# Patient Record
Sex: Female | Born: 1965 | Race: White | Hispanic: No | Marital: Married | Smoking: Never smoker
Health system: Southern US, Community
[De-identification: ages and names within clinical notes are randomized; demographics above are authoritative.]

## PROBLEM LIST (undated history)

## (undated) DIAGNOSIS — Z86718 Personal history of other venous thrombosis and embolism: Secondary | ICD-10-CM

## (undated) DIAGNOSIS — M1990 Unspecified osteoarthritis, unspecified site: Secondary | ICD-10-CM

## (undated) DIAGNOSIS — E559 Vitamin D deficiency, unspecified: Secondary | ICD-10-CM

## (undated) DIAGNOSIS — Z86711 Personal history of pulmonary embolism: Secondary | ICD-10-CM

## (undated) DIAGNOSIS — E882 Lipomatosis, not elsewhere classified: Secondary | ICD-10-CM

## (undated) DIAGNOSIS — I872 Venous insufficiency (chronic) (peripheral): Secondary | ICD-10-CM

## (undated) DIAGNOSIS — E7800 Pure hypercholesterolemia, unspecified: Secondary | ICD-10-CM

## (undated) DIAGNOSIS — Z87448 Personal history of other diseases of urinary system: Secondary | ICD-10-CM

## (undated) DIAGNOSIS — T8859XA Other complications of anesthesia, initial encounter: Secondary | ICD-10-CM

## (undated) DIAGNOSIS — K760 Fatty (change of) liver, not elsewhere classified: Secondary | ICD-10-CM

## (undated) DIAGNOSIS — G4733 Obstructive sleep apnea (adult) (pediatric): Secondary | ICD-10-CM

## (undated) HISTORY — DX: Personal history of other diseases of urinary system: Z87.448

## (undated) HISTORY — DX: Vitamin D deficiency, unspecified: E55.9

## (undated) HISTORY — DX: Personal history of pulmonary embolism: Z86.711

## (undated) HISTORY — DX: Personal history of other venous thrombosis and embolism: Z86.718

## (undated) HISTORY — DX: Venous insufficiency (chronic) (peripheral): I87.2

## (undated) HISTORY — DX: Pure hypercholesterolemia, unspecified: E78.00

## (undated) HISTORY — DX: Obstructive sleep apnea (adult) (pediatric): G47.33

## (undated) HISTORY — DX: Fatty (change of) liver, not elsewhere classified: K76.0

## (undated) HISTORY — PX: REPLACEMENT TOTAL KNEE: SUR1224

## (undated) HISTORY — DX: Unspecified osteoarthritis, unspecified site: M19.90

## (undated) HISTORY — PX: ABDOMINAL HYSTERECTOMY: SHX81

## (undated) HISTORY — PX: WISDOM TOOTH EXTRACTION: SHX21

## (undated) HISTORY — PX: NOSE SURGERY: SHX723

---
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
## 2007-04-28 HISTORY — PX: ARTHROSCOPY KNEE W/ DRILLING: SUR92

## 2008-04-27 HISTORY — PX: INCONTINENCE SURGERY: SHX676

## 2020-03-06 DIAGNOSIS — J0100 Acute maxillary sinusitis, unspecified: Secondary | ICD-10-CM | POA: Diagnosis not present

## 2020-05-01 DIAGNOSIS — Z20828 Contact with and (suspected) exposure to other viral communicable diseases: Secondary | ICD-10-CM | POA: Diagnosis not present

## 2020-05-01 DIAGNOSIS — J029 Acute pharyngitis, unspecified: Secondary | ICD-10-CM | POA: Diagnosis not present

## 2020-05-01 DIAGNOSIS — R519 Headache, unspecified: Secondary | ICD-10-CM | POA: Diagnosis not present

## 2020-05-16 DIAGNOSIS — Z20828 Contact with and (suspected) exposure to other viral communicable diseases: Secondary | ICD-10-CM | POA: Diagnosis not present

## 2020-05-16 DIAGNOSIS — R0981 Nasal congestion: Secondary | ICD-10-CM | POA: Diagnosis not present

## 2020-05-16 DIAGNOSIS — R519 Headache, unspecified: Secondary | ICD-10-CM | POA: Diagnosis not present

## 2020-05-16 DIAGNOSIS — J0100 Acute maxillary sinusitis, unspecified: Secondary | ICD-10-CM | POA: Diagnosis not present

## 2020-06-10 DIAGNOSIS — E559 Vitamin D deficiency, unspecified: Secondary | ICD-10-CM | POA: Diagnosis not present

## 2020-06-10 DIAGNOSIS — K760 Fatty (change of) liver, not elsewhere classified: Secondary | ICD-10-CM | POA: Diagnosis not present

## 2020-06-10 DIAGNOSIS — E785 Hyperlipidemia, unspecified: Secondary | ICD-10-CM | POA: Diagnosis not present

## 2020-06-10 DIAGNOSIS — Z131 Encounter for screening for diabetes mellitus: Secondary | ICD-10-CM | POA: Diagnosis not present

## 2020-06-10 DIAGNOSIS — E079 Disorder of thyroid, unspecified: Secondary | ICD-10-CM | POA: Diagnosis not present

## 2020-06-24 DIAGNOSIS — G4719 Other hypersomnia: Secondary | ICD-10-CM | POA: Diagnosis not present

## 2020-06-24 DIAGNOSIS — E7800 Pure hypercholesterolemia, unspecified: Secondary | ICD-10-CM | POA: Diagnosis not present

## 2020-06-24 DIAGNOSIS — R0683 Snoring: Secondary | ICD-10-CM | POA: Diagnosis not present

## 2020-06-24 DIAGNOSIS — M25561 Pain in right knee: Secondary | ICD-10-CM | POA: Diagnosis not present

## 2020-07-10 DIAGNOSIS — R928 Other abnormal and inconclusive findings on diagnostic imaging of breast: Secondary | ICD-10-CM | POA: Diagnosis not present

## 2020-07-10 DIAGNOSIS — N644 Mastodynia: Secondary | ICD-10-CM | POA: Diagnosis not present

## 2020-07-30 DIAGNOSIS — M1711 Unilateral primary osteoarthritis, right knee: Secondary | ICD-10-CM | POA: Diagnosis not present

## 2020-07-30 DIAGNOSIS — M21061 Valgus deformity, not elsewhere classified, right knee: Secondary | ICD-10-CM | POA: Diagnosis not present

## 2020-07-30 DIAGNOSIS — M1712 Unilateral primary osteoarthritis, left knee: Secondary | ICD-10-CM | POA: Diagnosis not present

## 2020-07-30 DIAGNOSIS — M25562 Pain in left knee: Secondary | ICD-10-CM | POA: Diagnosis not present

## 2020-07-30 DIAGNOSIS — G8929 Other chronic pain: Secondary | ICD-10-CM | POA: Diagnosis not present

## 2020-07-30 DIAGNOSIS — M170 Bilateral primary osteoarthritis of knee: Secondary | ICD-10-CM | POA: Diagnosis not present

## 2020-07-30 DIAGNOSIS — M25561 Pain in right knee: Secondary | ICD-10-CM | POA: Diagnosis not present

## 2020-07-30 DIAGNOSIS — M76891 Other specified enthesopathies of right lower limb, excluding foot: Secondary | ICD-10-CM | POA: Diagnosis not present

## 2020-08-15 DIAGNOSIS — M170 Bilateral primary osteoarthritis of knee: Secondary | ICD-10-CM | POA: Diagnosis not present

## 2020-08-20 DIAGNOSIS — M722 Plantar fascial fibromatosis: Secondary | ICD-10-CM | POA: Insufficient documentation

## 2020-08-20 DIAGNOSIS — M6702 Short Achilles tendon (acquired), left ankle: Secondary | ICD-10-CM | POA: Insufficient documentation

## 2020-09-20 DIAGNOSIS — E559 Vitamin D deficiency, unspecified: Secondary | ICD-10-CM | POA: Diagnosis not present

## 2020-09-20 DIAGNOSIS — G4733 Obstructive sleep apnea (adult) (pediatric): Secondary | ICD-10-CM | POA: Diagnosis not present

## 2020-09-20 DIAGNOSIS — M1710 Unilateral primary osteoarthritis, unspecified knee: Secondary | ICD-10-CM | POA: Diagnosis not present

## 2020-09-20 DIAGNOSIS — I83813 Varicose veins of bilateral lower extremities with pain: Secondary | ICD-10-CM | POA: Diagnosis not present

## 2020-10-04 ENCOUNTER — Other Ambulatory Visit: Payer: Self-pay | Admitting: *Deleted

## 2020-10-04 DIAGNOSIS — M79606 Pain in leg, unspecified: Secondary | ICD-10-CM

## 2020-10-31 ENCOUNTER — Ambulatory Visit (INDEPENDENT_AMBULATORY_CARE_PROVIDER_SITE_OTHER): Payer: BC Managed Care – PPO | Admitting: Physician Assistant

## 2020-10-31 ENCOUNTER — Ambulatory Visit (HOSPITAL_COMMUNITY)
Admission: RE | Admit: 2020-10-31 | Discharge: 2020-10-31 | Disposition: A | Discharge status: Home / Self Care | Payer: BC Managed Care – PPO | Source: Ambulatory Visit | Attending: Vascular Surgery | Admitting: Vascular Surgery

## 2020-10-31 ENCOUNTER — Other Ambulatory Visit: Payer: Self-pay

## 2020-10-31 VITALS — BP 129/75 | HR 79 | Temp 97.7°F | Resp 20 | Ht 64.0 in | Wt 188.4 lb

## 2020-10-31 DIAGNOSIS — M79606 Pain in leg, unspecified: Secondary | ICD-10-CM

## 2020-10-31 DIAGNOSIS — I872 Venous insufficiency (chronic) (peripheral): Secondary | ICD-10-CM | POA: Diagnosis not present

## 2020-10-31 DIAGNOSIS — I83893 Varicose veins of bilateral lower extremities with other complications: Secondary | ICD-10-CM

## 2020-10-31 NOTE — Progress Notes (Signed)
Requested by:  Julianne Handler, NP [address] [place],  [place] [address]  Reason for consultation: varicose veins, LE pain    History of Present Illness   Jacqueline Willis is a 55 y.o. (07-09-1965) female who presents for evaluation of bilateral lower extremity pain and varicosities. Pain is sometimes brought on by walking and alleviated with rest. Aggravated also by prolonged sitting.   Prior history significant for pulmonary emboli in 2004. Unprovoked without evidence of DVT at that time. Hospitalized one week on heparin and transitioned to coumadin. No longer on anticoagulation. Hematology work up negative for coagulopathy. 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  Venous symptoms include: positive if (X) [  x] aching [ ]  heavy [ x ] tired  [  ] throbbing [  ] burning  [  ] itching [  ]swelling [  ] bleeding [  ] ulcer  Onset/duration: sitting  Occupation:  years Aggravating factors: sitting Alleviating factors: none Compression:  yes Helps:  yes Pain medications:  none Previous vein procedures:  non History of DVT:  yes  History reviewed. No pertinent past medical history.  History reviewed. No pertinent surgical history.  Social History   Socioeconomic History   Marital status: Married    Spouse name: Not on file   Number of children: Not on file   Years of education: Not on file   Highest education level: Not on file  Occupational History   Not on file  Tobacco Use   Smoking status: Never   Smokeless tobacco: Never  Substance and Sexual Activity   Alcohol use: Not on file   Drug use: Never   Sexual activity: Never  Other Topics Concern   Not on file  Social History Narrative   Not on file   Social Determinants of Health   Financial Resource Strain: Not on file  Food Insecurity: Not on file  Transportation Needs: Not on file  Physical Activity: Not on file  Stress: Not on file   Social Connections: Not on file  Intimate Partner Violence: Not on file   History reviewed. No pertinent family history.  Current Outpatient Medications  Medication Sig Dispense Refill   aspirin 81 MG chewable tablet Chew by mouth.     Cholecalciferol 25 MCG (1000 UT) tablet Take by mouth.     No current facility-administered medications for this visit.    Allergies  Allergen Reactions   Sulfa Antibiotics Anaphylaxis    REVIEW OF SYSTEMS (negative unless checked):   Cardiac:  []  Chest pain or chest pressure? []  Shortness of breath upon activity? []  Shortness of breath when lying flat? []  Irregular heart rhythm?  Vascular:  []  Pain in calf, thigh, or hip brought on by walking? []  Pain in feet at night that wakes you up from your sleep? [x]  Blood clot in your veins? [x]  Leg swelling?  Pulmonary:  []  Oxygen at home? []  Productive cough? []  Wheezing?  Neurologic:  []  Sudden weakness in arms or legs? []  Sudden numbness in arms or legs? []  Sudden onset of difficult speaking or slurred speech? []  Temporary loss of vision in one eye? []  Problems with dizziness?  Gastrointestinal:  []  Blood in stool? []  Vomited blood?  Genitourinary:  []  Burning when urinating? []  Blood in urine?  Psychiatric:  []  Major depression  Hematologic:  []  Bleeding problems? []  Problems with blood clotting?  Dermatologic:  []  Rashes  or ulcers?  Constitutional:  []  Fever or chills?  Ear/Nose/Throat:  []  Change in hearing? []  Nose bleeds? []  Sore throat?  Musculoskeletal:  []  Back pain? [x]  Joint pain? Hx of left plantar fasciitis []  Muscle pain?   Physical Examination     Vitals:   10/31/20 1058  BP: 129/75  Pulse: 79  Resp: 20  Temp: 97.7 F (36.5 C)  TempSrc: Temporal  SpO2: 100%  Weight: 188 lb 6.4 oz (85.5 kg)  Height: 5\' 4"  (1.626 m)   Body mass index is 32.34 kg/m.  General:  WDWN in NAD; vital signs documented above Gait: unaided; no ataxia HENT:  WNL, normocephalic Pulmonary: normal non-labored breathing , without Rales, rhonchi,  wheezing Cardiac: regular HR, without  Murmurs  Skin: without rashes Vascular Exam/Pulses: 2+ dorsalis pedis, posterior tibial pulses bilaterally Extremities: with varicose veins, with reticular veins, without edema, without stasis pigmentation, without lipodermatosclerosis, without ulcers Musculoskeletal: no muscle wasting or atrophy  Neurologic: A&O X 3;  No focal weakness or paresthesias are detected Psychiatric:  The pt has Normal affect.  Non-invasive Vascular Imaging   BLE Venous Insufficiency Duplex  Right:  - No evidence of deep vein thrombosis seen in the right lower extremity,  from the common femoral through the popliteal veins.  - No evidence of superficial venous reflux seen in the right short  saphenous vein.  - Venous reflux is noted in the right common femoral vein.  - Venous reflux is noted in the right greater saphenous vein in the thigh.   - 2.91 x 1.13 fluid collection in the popliteal fossa.  - Enlarged lymph node left groin.     Left:  - No evidence of deep vein thrombosis seen in the left lower extremity,  from the common femoral through the popliteal veins.  - No evidence of superficial venous reflux seen in the left short  saphenous vein.  - Venous reflux is noted in the left common femoral vein.  - Venous reflux is noted in the left sapheno-femoral junction.  - Venous reflux is noted in the left greater saphenous vein in the thigh.  - Venous reflux is noted in the left greater saphenous vein in the calf.  - Venous reflux is noted in the left femoral vein.     *See table(s) above for measurements and observations.   Medical Decision Making   Jacqueline Willis is a 55 y.o. female who presents with: BLE chronic venous insufficiency with reflux in one segment of the mid thigh right GSV without SFJ reflux. On the left, there are several segments of reflux of the GSV but no  significant dilation of the vein diameter. BLE varicosities and reticular veins.  No evidence of lower extremity deep venous thrombosis and no evidence of arterial insufficiency. Based on the patient's history and examination, I recommend: healthy vein measures including elevation and compression stockings. She is given written information in regards to these measures. Return to clinic for re-evaluation should these measures fail or symptoms worsen. Thank you for allowing to participate in this patient's care.   , PA-C [facility] Office: [phone]  10/31/2020, 1:06 PM  Clinic MD: Dr. 

## 2020-11-19 DIAGNOSIS — G4733 Obstructive sleep apnea (adult) (pediatric): Secondary | ICD-10-CM | POA: Diagnosis not present

## 2020-11-30 DIAGNOSIS — R0602 Shortness of breath: Secondary | ICD-10-CM | POA: Diagnosis not present

## 2020-11-30 DIAGNOSIS — G4733 Obstructive sleep apnea (adult) (pediatric): Secondary | ICD-10-CM | POA: Diagnosis not present

## 2020-12-01 DIAGNOSIS — R0602 Shortness of breath: Secondary | ICD-10-CM | POA: Diagnosis not present

## 2020-12-01 DIAGNOSIS — G4733 Obstructive sleep apnea (adult) (pediatric): Secondary | ICD-10-CM | POA: Diagnosis not present

## 2020-12-04 DIAGNOSIS — J329 Chronic sinusitis, unspecified: Secondary | ICD-10-CM | POA: Diagnosis not present

## 2020-12-04 DIAGNOSIS — Z20828 Contact with and (suspected) exposure to other viral communicable diseases: Secondary | ICD-10-CM | POA: Diagnosis not present

## 2020-12-04 DIAGNOSIS — N3001 Acute cystitis with hematuria: Secondary | ICD-10-CM | POA: Diagnosis not present

## 2020-12-04 DIAGNOSIS — N390 Urinary tract infection, site not specified: Secondary | ICD-10-CM | POA: Diagnosis not present

## 2020-12-20 DIAGNOSIS — G4733 Obstructive sleep apnea (adult) (pediatric): Secondary | ICD-10-CM | POA: Diagnosis not present

## 2020-12-24 DIAGNOSIS — M545 Low back pain, unspecified: Secondary | ICD-10-CM | POA: Diagnosis not present

## 2020-12-24 DIAGNOSIS — E669 Obesity, unspecified: Secondary | ICD-10-CM | POA: Diagnosis not present

## 2020-12-24 DIAGNOSIS — Z7689 Persons encountering health services in other specified circumstances: Secondary | ICD-10-CM | POA: Diagnosis not present

## 2020-12-24 DIAGNOSIS — K760 Fatty (change of) liver, not elsewhere classified: Secondary | ICD-10-CM | POA: Diagnosis not present

## 2020-12-24 DIAGNOSIS — E559 Vitamin D deficiency, unspecified: Secondary | ICD-10-CM | POA: Diagnosis not present

## 2020-12-24 DIAGNOSIS — I872 Venous insufficiency (chronic) (peripheral): Secondary | ICD-10-CM | POA: Diagnosis not present

## 2020-12-24 DIAGNOSIS — G4730 Sleep apnea, unspecified: Secondary | ICD-10-CM | POA: Diagnosis not present

## 2020-12-24 DIAGNOSIS — E882 Lipomatosis, not elsewhere classified: Secondary | ICD-10-CM | POA: Diagnosis not present

## 2021-01-20 DIAGNOSIS — G4733 Obstructive sleep apnea (adult) (pediatric): Secondary | ICD-10-CM | POA: Diagnosis not present

## 2021-01-28 DIAGNOSIS — M545 Low back pain, unspecified: Secondary | ICD-10-CM | POA: Diagnosis not present

## 2021-01-28 DIAGNOSIS — E882 Lipomatosis, not elsewhere classified: Secondary | ICD-10-CM | POA: Diagnosis not present

## 2021-01-28 DIAGNOSIS — K760 Fatty (change of) liver, not elsewhere classified: Secondary | ICD-10-CM | POA: Diagnosis not present

## 2021-01-28 DIAGNOSIS — G8929 Other chronic pain: Secondary | ICD-10-CM | POA: Diagnosis not present

## 2021-01-28 DIAGNOSIS — M25562 Pain in left knee: Secondary | ICD-10-CM | POA: Diagnosis not present

## 2021-01-28 DIAGNOSIS — E669 Obesity, unspecified: Secondary | ICD-10-CM | POA: Diagnosis not present

## 2021-01-28 DIAGNOSIS — M25561 Pain in right knee: Secondary | ICD-10-CM | POA: Diagnosis not present

## 2021-01-29 ENCOUNTER — Ambulatory Visit: Payer: BC Managed Care – PPO | Admitting: Vascular Surgery

## 2021-02-19 DIAGNOSIS — G4733 Obstructive sleep apnea (adult) (pediatric): Secondary | ICD-10-CM | POA: Diagnosis not present

## 2021-03-03 DIAGNOSIS — M25561 Pain in right knee: Secondary | ICD-10-CM | POA: Diagnosis not present

## 2021-03-03 DIAGNOSIS — M545 Low back pain, unspecified: Secondary | ICD-10-CM | POA: Diagnosis not present

## 2021-03-03 DIAGNOSIS — G8929 Other chronic pain: Secondary | ICD-10-CM | POA: Diagnosis not present

## 2021-03-03 DIAGNOSIS — M25562 Pain in left knee: Secondary | ICD-10-CM | POA: Diagnosis not present

## 2021-03-13 DIAGNOSIS — M545 Low back pain, unspecified: Secondary | ICD-10-CM | POA: Diagnosis not present

## 2021-03-13 DIAGNOSIS — M1711 Unilateral primary osteoarthritis, right knee: Secondary | ICD-10-CM | POA: Diagnosis not present

## 2021-03-18 DIAGNOSIS — G894 Chronic pain syndrome: Secondary | ICD-10-CM | POA: Diagnosis not present

## 2021-03-18 DIAGNOSIS — M549 Dorsalgia, unspecified: Secondary | ICD-10-CM | POA: Diagnosis not present

## 2021-03-18 DIAGNOSIS — M179 Osteoarthritis of knee, unspecified: Secondary | ICD-10-CM | POA: Diagnosis not present

## 2021-03-18 DIAGNOSIS — Z79899 Other long term (current) drug therapy: Secondary | ICD-10-CM | POA: Diagnosis not present

## 2021-03-18 DIAGNOSIS — Z1389 Encounter for screening for other disorder: Secondary | ICD-10-CM | POA: Diagnosis not present

## 2021-03-19 ENCOUNTER — Encounter: Payer: Self-pay | Admitting: Vascular Surgery

## 2021-03-19 ENCOUNTER — Ambulatory Visit (INDEPENDENT_AMBULATORY_CARE_PROVIDER_SITE_OTHER): Payer: BC Managed Care – PPO | Admitting: Vascular Surgery

## 2021-03-19 ENCOUNTER — Other Ambulatory Visit: Payer: Self-pay

## 2021-03-19 VITALS — BP 133/83 | HR 80 | Temp 98.3°F | Resp 18 | Ht 64.75 in | Wt 193.5 lb

## 2021-03-19 DIAGNOSIS — I872 Venous insufficiency (chronic) (peripheral): Secondary | ICD-10-CM

## 2021-03-19 DIAGNOSIS — M7989 Other specified soft tissue disorders: Secondary | ICD-10-CM

## 2021-03-19 DIAGNOSIS — I83893 Varicose veins of bilateral lower extremities with other complications: Secondary | ICD-10-CM

## 2021-03-19 NOTE — Progress Notes (Signed)
REASON FOR VISIT:   For follow-up of chronic venous insufficiency.  MEDICAL ISSUES:    CHRONIC VENOUS INSUFFICIENCY: This patient has CEAP C1 venous disease (telangiectasias/reticular veins).  She does have significant symptoms of venous hypertension.  We have discussed the importance of intermittent leg elevation and the proper positioning for this.  I have encouraged her to continue to wear her knee-high compression stockings as she did not tolerate the thigh-high compression stockings.  We have discussed the importance of exercise specifically walking and water aerobics.  She just got a pool and plans on using this.  We also discussed the importance of maintaining a healthy weight.  I also encouraged her to keep her skin well lubricated.  Currently I do not think she would benefits from laser ablation of the left great saphenous vein.  I have explained however that venous disease tends to be progressive and that if her symptoms worsen in the future or she develops large truncal varicosities we would have to repeat her venous reflux study to see if things have progressed.  She will call if her symptoms progress.  I will see her as needed.   HPI:   Jacqueline Willis is a pleasant 55 y.o. female who was seen by Wendi Maya, PA on 10/31/2020.  She was seen with painful varicose veins of both lower extremities.  Her symptoms were aggravated by prolonged sitting.  Walking also aggravated her symptoms.  She had a pulmonary embolus in 2004.  She was no longer on anticoagulation.  She apparently had a hematologic work-up which was unremarkable.  She has a history of a left lower extremity DVT in 1990 when she was pregnant.  Her venous duplex scan showed evidence of significant chronic venous insufficiency.  She was encouraged to elevate her legs and wear compression stockings.  She comes in for a 23-month follow-up visit.  On my history, the patient describes significant aching pain heaviness and a tired  feeling in her legs which is aggravated by standing and sitting and relieved somewhat with elevation.  She tried the thigh-high compression stockings with a gradient of 20 to 30 mmHg but found these very uncomfortable.  For this reason she has been wearing a knee-high stocking which helps her symptoms some.  Her symptoms have been relatively stable.  She does describe some issues with swelling in both legs.  Her symptoms are equal on both sides.  I do not get any history of claudication or rest pain.  Past Medical History:  Diagnosis Date   Arthritis     History reviewed. No pertinent family history.  SOCIAL HISTORY: Social History   Tobacco Use   Smoking status: Never   Smokeless tobacco: Never  Substance Use Topics   Alcohol use: Never    Allergies  Allergen Reactions   Sulfa Antibiotics Anaphylaxis    Current Outpatient Medications  Medication Sig Dispense Refill   aspirin 81 MG chewable tablet Chew by mouth.     Cholecalciferol 25 MCG (1000 UT) tablet Take by mouth.     No current facility-administered medications for this visit.    REVIEW OF SYSTEMS:  [X]  denotes positive finding, [ ]  denotes negative finding Cardiac  Comments:  Chest pain or chest pressure:    Shortness of breath upon exertion:    Short of breath when lying flat:    Irregular heart rhythm:        Vascular    Pain in calf, thigh, or hip brought on by  ambulation:    Pain in feet at night that wakes you up from your sleep:     Blood clot in your veins:    Leg swelling:  x       Pulmonary    Oxygen at home:    Productive cough:     Wheezing:         Neurologic    Sudden weakness in arms or legs:     Sudden numbness in arms or legs:     Sudden onset of difficulty speaking or slurred speech:    Temporary loss of vision in one eye:     Problems with dizziness:         Gastrointestinal    Blood in stool:     Vomited blood:         Genitourinary    Burning when urinating:     Blood in  urine:        Psychiatric    Major depression:         Hematologic    Bleeding problems:    Problems with blood clotting too easily:        Skin    Rashes or ulcers:        Constitutional    Fever or chills:     PHYSICAL EXAM:   Vitals:   03/19/21 1355  BP: 133/83  Pulse: 80  Resp: 18  Temp: 98.3 F (36.8 C)  TempSrc: Temporal  SpO2: 98%  Weight: 193 lb 8 oz (87.8 kg)  Height: 5' 4.75" (1.645 m)    GENERAL: The patient is a well-nourished female, in no acute distress. The vital signs are documented above. CARDIAC: There is a regular rate and rhythm.  VASCULAR: I do not detect carotid bruits. She has palpable pedal pulses. She has some reticular veins and spider veins bilaterally but no large truncal varicosities. She has minimal leg swelling.  She has no hyperpigmentation. I did look at her left great saphenous vein myself with the SonoSite.  She has reflux in the proximal thigh only.  The vein is not especially dilated. PULMONARY: There is good air exchange bilaterally without wheezing or rales. ABDOMEN: Soft and non-tender with normal pitched bowel sounds.  MUSCULOSKELETAL: There are no major deformities or cyanosis. NEUROLOGIC: No focal weakness or paresthesias are detected. SKIN: There are no ulcers or rashes noted. PSYCHIATRIC: The patient has a normal affect.  DATA:    VENOUS DUPLEX: I have reviewed the venous duplex scan that was done on 10/31/2020.  On the right side, there was no evidence of DVT.  There was deep venous reflux in the common femoral vein.  There was only a short segment of superficial venous reflux in the right great saphenous vein in the mid thigh.  On the left side, there was no evidence of DVT.  There was deep venous reflux involving the common femoral vein and femoral vein.  There was superficial venous reflux in the left great saphenous vein.  Largest diameter was 5.1 mm.     Waverly Ferrari [facility] [phone]

## 2021-03-22 DIAGNOSIS — G4733 Obstructive sleep apnea (adult) (pediatric): Secondary | ICD-10-CM | POA: Diagnosis not present

## 2021-03-27 DIAGNOSIS — M79604 Pain in right leg: Secondary | ICD-10-CM | POA: Diagnosis not present

## 2021-03-27 DIAGNOSIS — M2560 Stiffness of unspecified joint, not elsewhere classified: Secondary | ICD-10-CM | POA: Diagnosis not present

## 2021-03-27 DIAGNOSIS — R293 Abnormal posture: Secondary | ICD-10-CM | POA: Diagnosis not present

## 2021-03-27 DIAGNOSIS — R2689 Other abnormalities of gait and mobility: Secondary | ICD-10-CM | POA: Diagnosis not present

## 2021-03-27 DIAGNOSIS — M549 Dorsalgia, unspecified: Secondary | ICD-10-CM | POA: Diagnosis not present

## 2021-03-31 DIAGNOSIS — M2560 Stiffness of unspecified joint, not elsewhere classified: Secondary | ICD-10-CM | POA: Diagnosis not present

## 2021-03-31 DIAGNOSIS — M549 Dorsalgia, unspecified: Secondary | ICD-10-CM | POA: Diagnosis not present

## 2021-03-31 DIAGNOSIS — R293 Abnormal posture: Secondary | ICD-10-CM | POA: Diagnosis not present

## 2021-03-31 DIAGNOSIS — M79604 Pain in right leg: Secondary | ICD-10-CM | POA: Diagnosis not present

## 2021-03-31 DIAGNOSIS — R2689 Other abnormalities of gait and mobility: Secondary | ICD-10-CM | POA: Diagnosis not present

## 2021-04-07 DIAGNOSIS — R0782 Intercostal pain: Secondary | ICD-10-CM | POA: Diagnosis not present

## 2021-04-11 DIAGNOSIS — R0781 Pleurodynia: Secondary | ICD-10-CM | POA: Diagnosis not present

## 2021-04-21 DIAGNOSIS — G4733 Obstructive sleep apnea (adult) (pediatric): Secondary | ICD-10-CM | POA: Diagnosis not present

## 2021-04-28 DIAGNOSIS — Z20822 Contact with and (suspected) exposure to covid-19: Secondary | ICD-10-CM | POA: Diagnosis not present

## 2021-04-28 DIAGNOSIS — R300 Dysuria: Secondary | ICD-10-CM | POA: Diagnosis not present

## 2021-04-28 DIAGNOSIS — J0110 Acute frontal sinusitis, unspecified: Secondary | ICD-10-CM | POA: Diagnosis not present

## 2021-04-28 DIAGNOSIS — R519 Headache, unspecified: Secondary | ICD-10-CM | POA: Diagnosis not present

## 2021-05-05 DIAGNOSIS — E882 Lipomatosis, not elsewhere classified: Secondary | ICD-10-CM | POA: Diagnosis not present

## 2021-05-05 DIAGNOSIS — M545 Low back pain, unspecified: Secondary | ICD-10-CM | POA: Diagnosis not present

## 2021-05-05 DIAGNOSIS — E669 Obesity, unspecified: Secondary | ICD-10-CM | POA: Diagnosis not present

## 2021-05-05 DIAGNOSIS — N390 Urinary tract infection, site not specified: Secondary | ICD-10-CM | POA: Diagnosis not present

## 2021-05-05 DIAGNOSIS — G8929 Other chronic pain: Secondary | ICD-10-CM | POA: Diagnosis not present

## 2021-05-29 DIAGNOSIS — Z6831 Body mass index (BMI) 31.0-31.9, adult: Secondary | ICD-10-CM | POA: Diagnosis not present

## 2021-05-29 DIAGNOSIS — E669 Obesity, unspecified: Secondary | ICD-10-CM | POA: Diagnosis not present

## 2021-05-29 DIAGNOSIS — R8290 Unspecified abnormal findings in urine: Secondary | ICD-10-CM | POA: Diagnosis not present

## 2021-05-29 DIAGNOSIS — R109 Unspecified abdominal pain: Secondary | ICD-10-CM | POA: Diagnosis not present

## 2021-06-16 DIAGNOSIS — M545 Low back pain, unspecified: Secondary | ICD-10-CM | POA: Diagnosis not present

## 2021-06-26 DIAGNOSIS — C44319 Basal cell carcinoma of skin of other parts of face: Secondary | ICD-10-CM | POA: Diagnosis not present

## 2021-06-26 DIAGNOSIS — L820 Inflamed seborrheic keratosis: Secondary | ICD-10-CM | POA: Diagnosis not present

## 2021-06-26 DIAGNOSIS — D171 Benign lipomatous neoplasm of skin and subcutaneous tissue of trunk: Secondary | ICD-10-CM | POA: Diagnosis not present

## 2021-07-14 DIAGNOSIS — Z6830 Body mass index (BMI) 30.0-30.9, adult: Secondary | ICD-10-CM | POA: Diagnosis not present

## 2021-07-14 DIAGNOSIS — M5441 Lumbago with sciatica, right side: Secondary | ICD-10-CM | POA: Diagnosis not present

## 2021-07-14 DIAGNOSIS — G8929 Other chronic pain: Secondary | ICD-10-CM | POA: Diagnosis not present

## 2021-07-14 DIAGNOSIS — M5442 Lumbago with sciatica, left side: Secondary | ICD-10-CM | POA: Diagnosis not present

## 2021-07-22 DIAGNOSIS — M722 Plantar fascial fibromatosis: Secondary | ICD-10-CM | POA: Diagnosis not present

## 2021-07-22 DIAGNOSIS — I7300 Raynaud's syndrome without gangrene: Secondary | ICD-10-CM | POA: Diagnosis not present

## 2021-07-26 DIAGNOSIS — K529 Noninfective gastroenteritis and colitis, unspecified: Secondary | ICD-10-CM | POA: Diagnosis not present

## 2021-07-26 DIAGNOSIS — R519 Headache, unspecified: Secondary | ICD-10-CM | POA: Diagnosis not present

## 2021-07-26 DIAGNOSIS — Z20822 Contact with and (suspected) exposure to covid-19: Secondary | ICD-10-CM | POA: Diagnosis not present

## 2021-07-26 DIAGNOSIS — E860 Dehydration: Secondary | ICD-10-CM | POA: Diagnosis not present

## 2021-07-31 ENCOUNTER — Other Ambulatory Visit: Payer: Self-pay | Admitting: Internal Medicine

## 2021-07-31 DIAGNOSIS — M5442 Lumbago with sciatica, left side: Secondary | ICD-10-CM

## 2021-08-05 DIAGNOSIS — Z1331 Encounter for screening for depression: Secondary | ICD-10-CM | POA: Diagnosis not present

## 2021-08-05 DIAGNOSIS — Z6829 Body mass index (BMI) 29.0-29.9, adult: Secondary | ICD-10-CM | POA: Diagnosis not present

## 2021-08-05 DIAGNOSIS — E782 Mixed hyperlipidemia: Secondary | ICD-10-CM | POA: Diagnosis not present

## 2021-08-05 DIAGNOSIS — E663 Overweight: Secondary | ICD-10-CM | POA: Diagnosis not present

## 2021-08-05 DIAGNOSIS — I872 Venous insufficiency (chronic) (peripheral): Secondary | ICD-10-CM | POA: Diagnosis not present

## 2021-08-05 DIAGNOSIS — K760 Fatty (change of) liver, not elsewhere classified: Secondary | ICD-10-CM | POA: Diagnosis not present

## 2021-08-05 DIAGNOSIS — E559 Vitamin D deficiency, unspecified: Secondary | ICD-10-CM | POA: Diagnosis not present

## 2021-08-05 DIAGNOSIS — Z0000 Encounter for general adult medical examination without abnormal findings: Secondary | ICD-10-CM | POA: Diagnosis not present

## 2021-08-10 ENCOUNTER — Inpatient Hospital Stay: Admission: RE | Admit: 2021-08-10 | Payer: BC Managed Care – PPO | Source: Ambulatory Visit

## 2021-08-10 ENCOUNTER — Other Ambulatory Visit: Payer: BC Managed Care – PPO

## 2021-08-23 ENCOUNTER — Ambulatory Visit
Admission: RE | Admit: 2021-08-23 | Discharge: 2021-08-23 | Disposition: A | Discharge status: Home / Self Care | Payer: BC Managed Care – PPO | Source: Ambulatory Visit | Attending: Internal Medicine | Admitting: Internal Medicine

## 2021-08-23 DIAGNOSIS — M1611 Unilateral primary osteoarthritis, right hip: Secondary | ICD-10-CM | POA: Diagnosis not present

## 2021-08-23 DIAGNOSIS — M25551 Pain in right hip: Secondary | ICD-10-CM | POA: Diagnosis not present

## 2021-08-23 DIAGNOSIS — R531 Weakness: Secondary | ICD-10-CM | POA: Diagnosis not present

## 2021-08-23 DIAGNOSIS — M5442 Lumbago with sciatica, left side: Secondary | ICD-10-CM

## 2021-08-23 DIAGNOSIS — M79604 Pain in right leg: Secondary | ICD-10-CM | POA: Diagnosis not present

## 2021-08-23 DIAGNOSIS — M545 Low back pain, unspecified: Secondary | ICD-10-CM | POA: Diagnosis not present

## 2021-08-23 IMAGING — MR MR LUMBAR SPINE W/O CM
4 of 5 series · 26 of 48 positions shown · non-contrast
Comparison: No prior MRI, correlation is made with lumbar spine
radiographs [DATE]

CLINICAL DATA: Low back pain with right hip pain and right leg pain

EXAM:
MRI LUMBAR SPINE WITHOUT CONTRAST
TECHNIQUE: Multiplanar, multisequence MR imaging of the lumbar spine was
performed. No intravenous contrast was administered.

[Series 2: T2 · sagittal · 4.0mm · 0.53mm/px · 6 of 15 slices shown (1 of 2)]
[im 1/15]
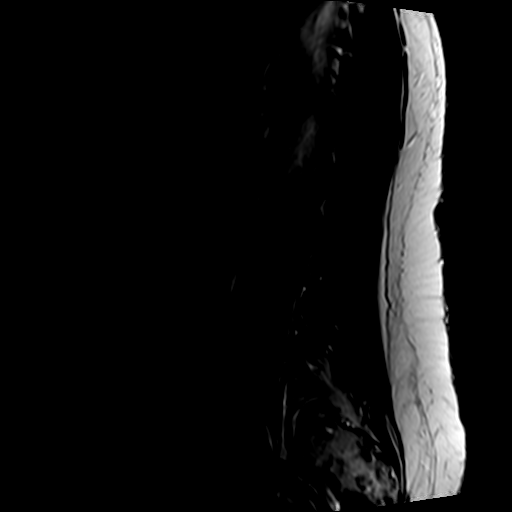
[im 3/15]
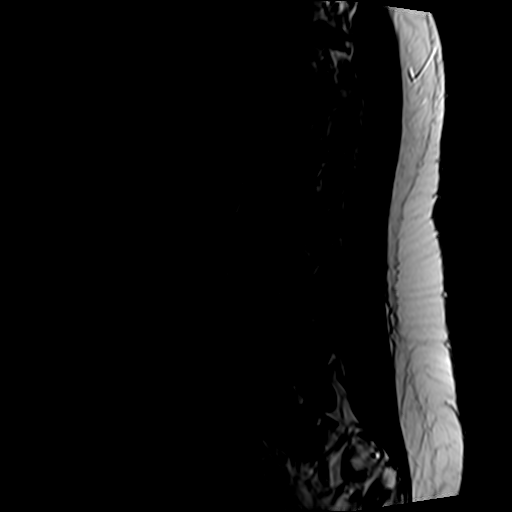
[im 6/15]
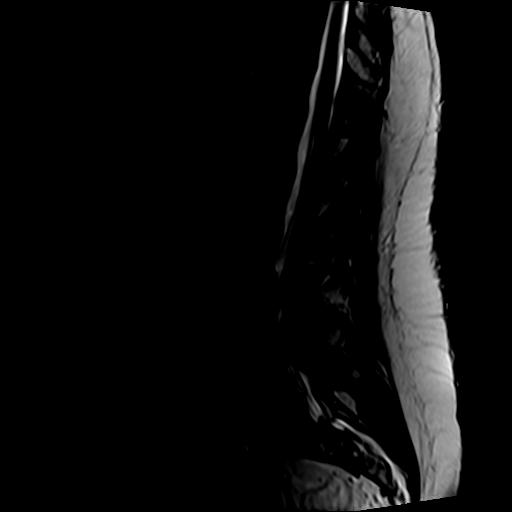
[im 9/15]
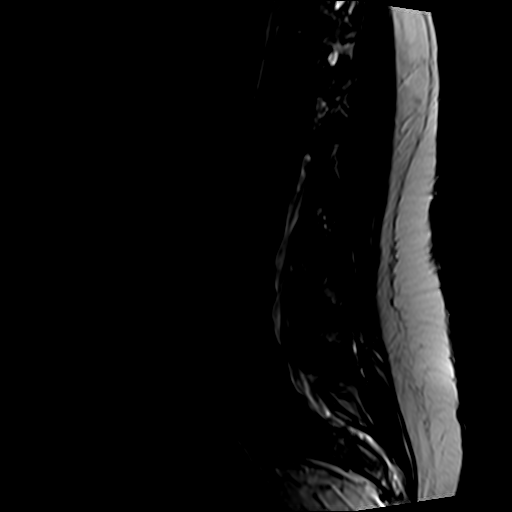
[im 12/15]
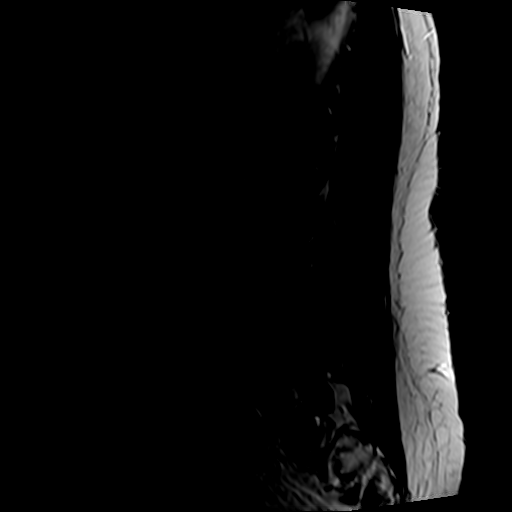
[im 15/15]
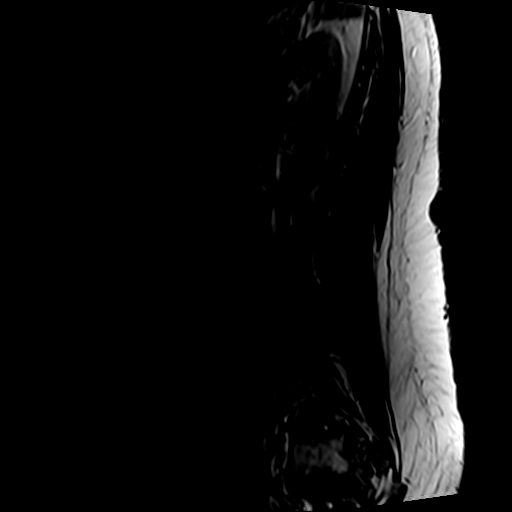

[Series 4: T1 · sagittal · 4.0mm · 0.53mm/px · 6 of 15 slices shown (1 of 2)]
[im 1/15]
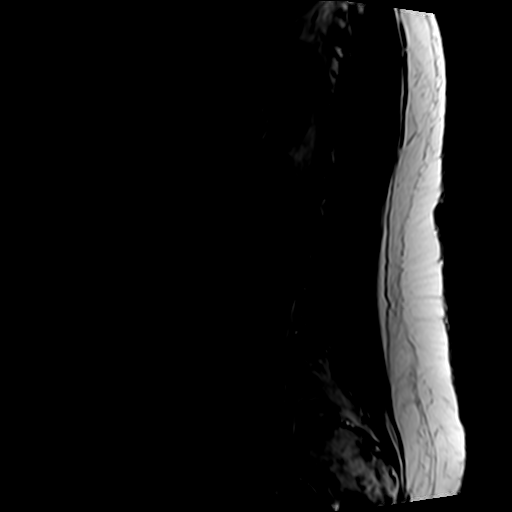
[im 3/15]
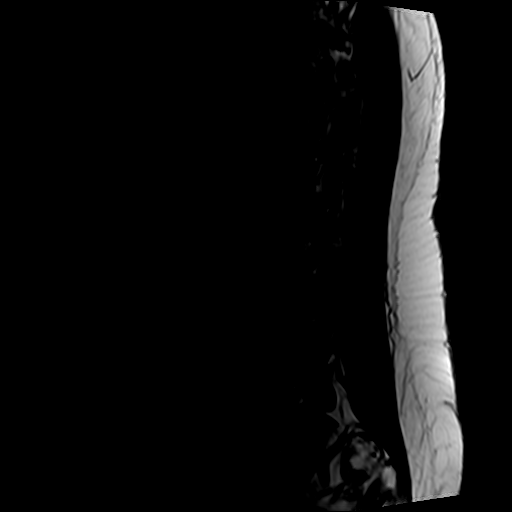
[im 6/15]
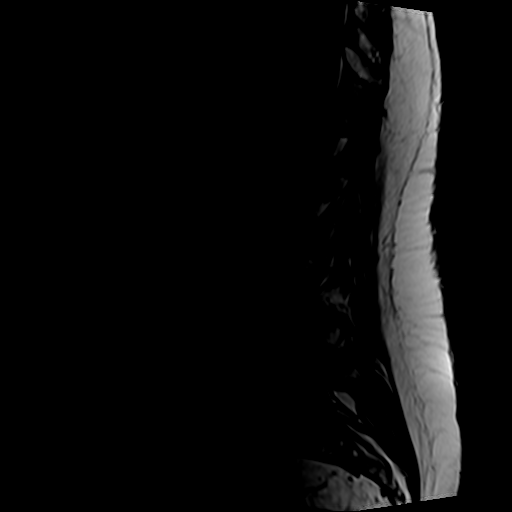
[im 9/15]
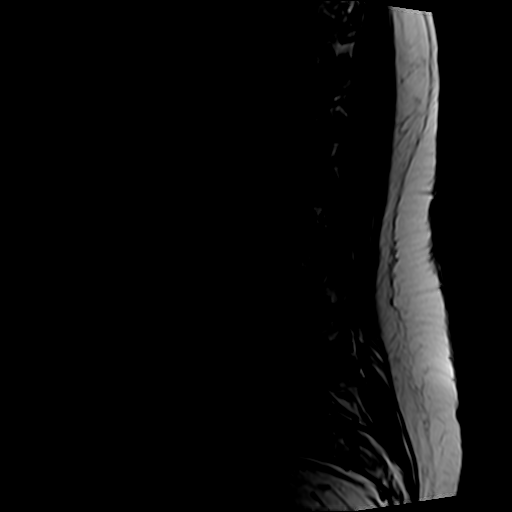
[im 12/15]
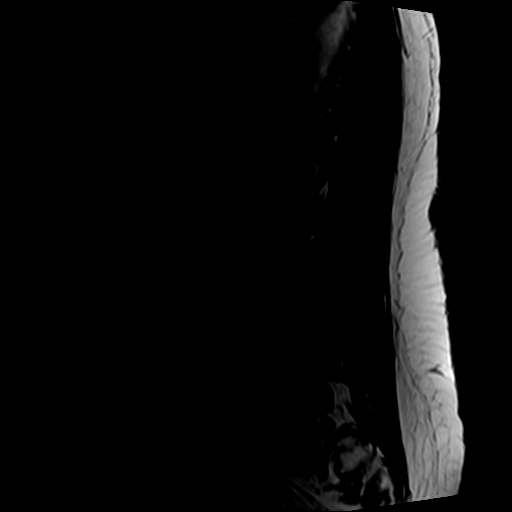
[im 15/15]
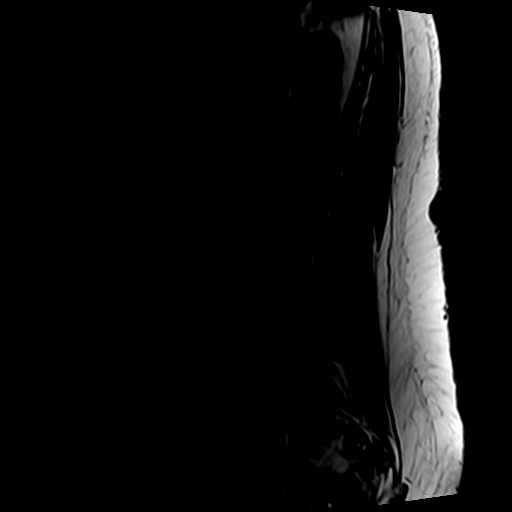

[Series 5: T2 · axial · 4.0mm · 0.70mm/px · z∈[+17,+207]mm · 9 of 35 slices shown (2 of 2)]
[im 1/35]
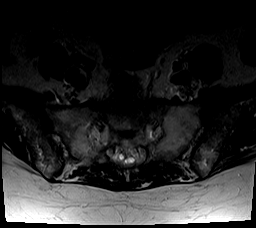
[im 5/35]
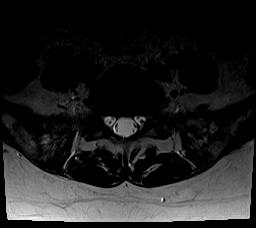
[im 10/35]
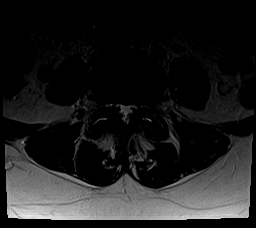
[im 15/35]
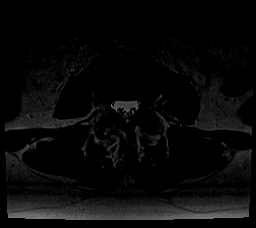
[im 18/35]
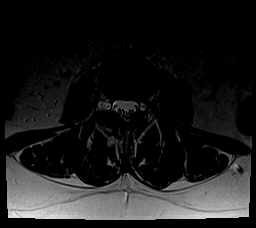
[im 20/35]
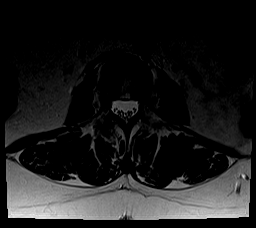
[im 25/35]
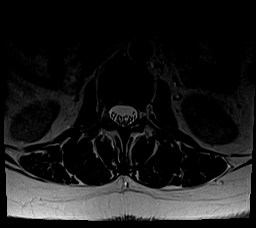
[im 30/35]
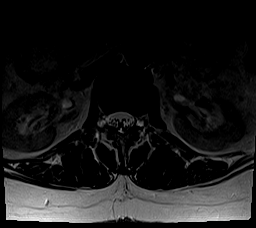
[im 35/35]
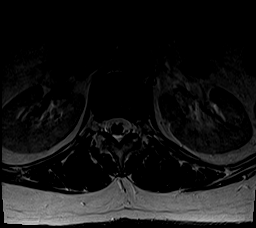

[Series 6: T1 · axial · 4.0mm · 0.35mm/px · z∈[+17,+181]mm · 5 of 35 slices shown (2 of 2)]
[im 1/35]
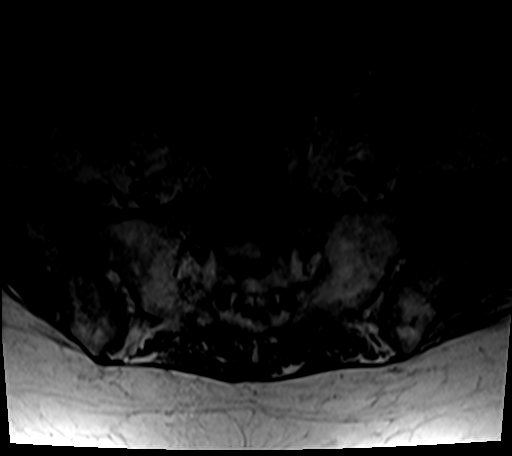
[im 5/35]
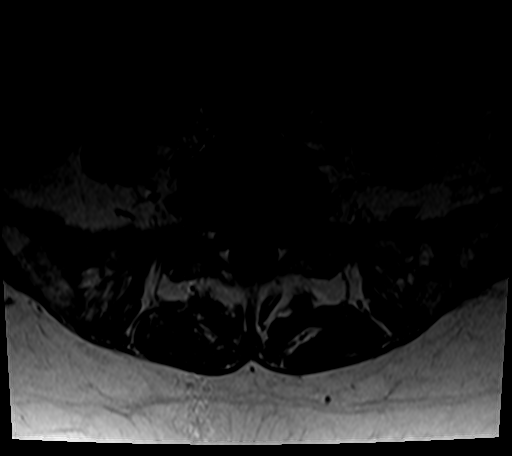
[im 10/35]
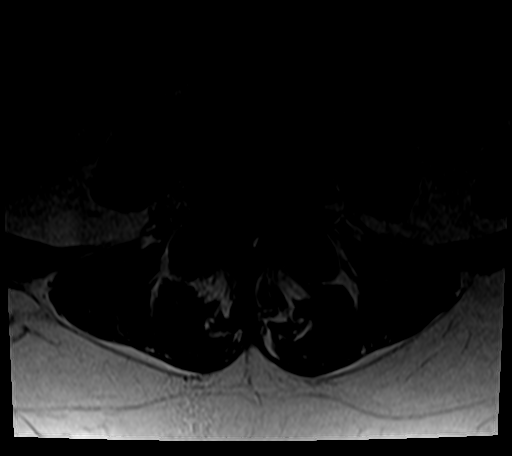
[im 18/35]
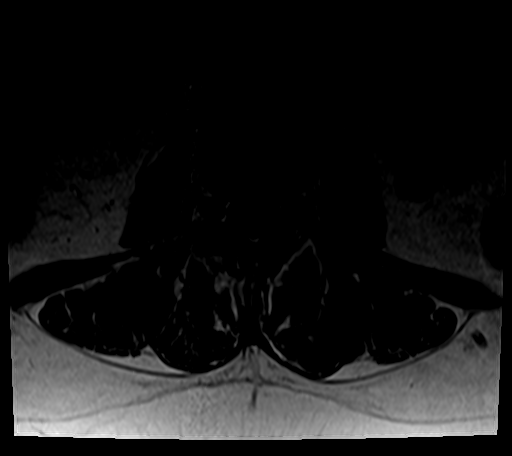
[im 30/35]
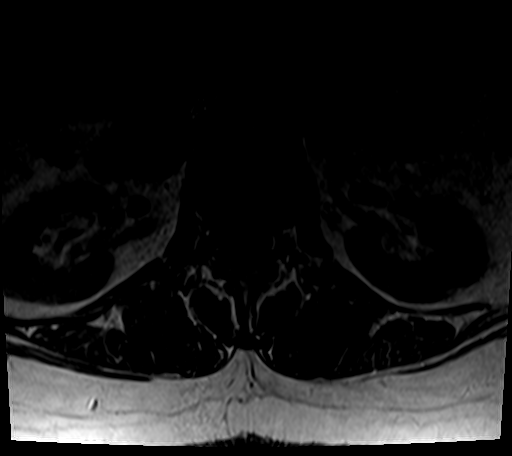

[26 of 48 positions shown; findings below may reference images not displayed]

FINDINGS: Segmentation:  Standard.

Alignment:  Mild levocurvature.  No listhesis.

Vertebrae: No acute fracture or suspicious osseous lesion. Diffusely
decreased marrow signal.

Conus medullaris and cauda equina: Conus extends to the L1-L2 level.
Conus and cauda equina appear normal.

Paraspinal and other soft tissues: Left parapelvic renal cyst.

Disc levels:

T12-L1: No significant disc bulge. No spinal canal stenosis or
neural foraminal narrowing.

L1-L2: No significant disc bulge. No spinal canal stenosis or neural
foraminal narrowing.

L2-L3: No significant disc bulge. No spinal canal stenosis or neural
foraminal narrowing.

L3-L4: No significant disc bulge. Mild facet arthropathy. No spinal
canal stenosis or neural foraminal narrowing.

L4-L5: No significant disc bulge. Moderate to severe facet
arthropathy. No spinal canal stenosis. Narrowing of the bilateral
lateral recesses. Mild bilateral neural foraminal narrowing.

L5-S1: No significant disc bulge. No spinal canal stenosis. Mild
left neural foraminal narrowing.
IMPRESSION: 1. L4-L5 moderate to severe facet arthropathy, which can be a cause
back pain, with mild bilateral neural foraminal narrowing. Narrowing
of the lateral recess at this level could affect the descending L5
nerve roots.
2. L5-S1 mild left neural foraminal narrowing.
3. Diffusely decreased marrow signal, which is nonspecific; although
this can be caused by an infiltrative marrow process, the most
common causes include anemia, obesity, or tobacco use.

## 2021-08-23 IMAGING — MR MR HIP*R* W/O CM
4 of 5 series · 22 of 40 positions shown · non-contrast
Comparison: Lumbar spine radiographs [DATE]

CLINICAL DATA: Lower back and right hip and leg pain. Right leg
weakness. Symptoms for 3 months.

EXAM:
MR OF THE RIGHT HIP WITHOUT CONTRAST
TECHNIQUE: Multiplanar, multisequence MR imaging was performed. No intravenous
contrast was administered.

[Series 3: T1 · coronal · 5.0mm · 0.74mm/px · 7 of 28 slices shown]
[im 1/28]
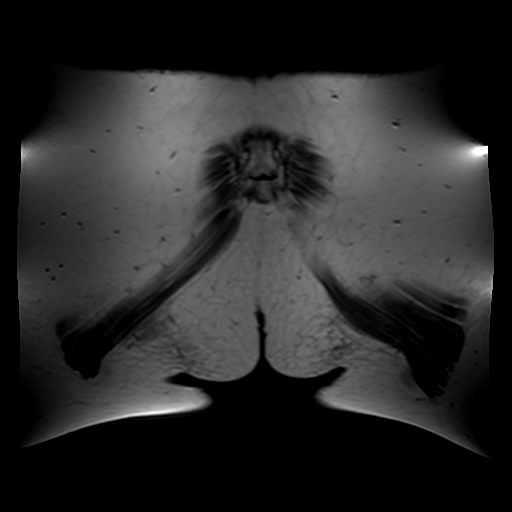
[im 5/28]
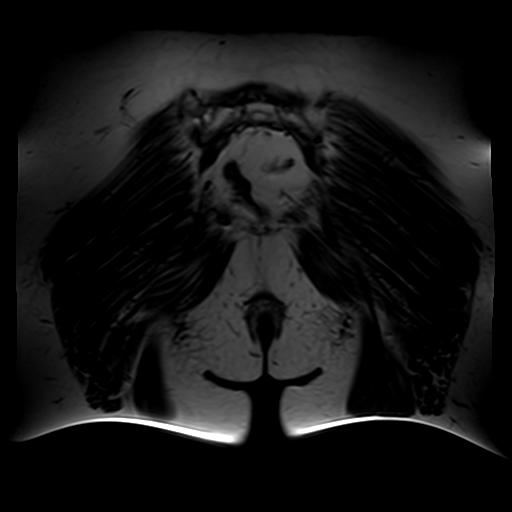
[im 10/28]
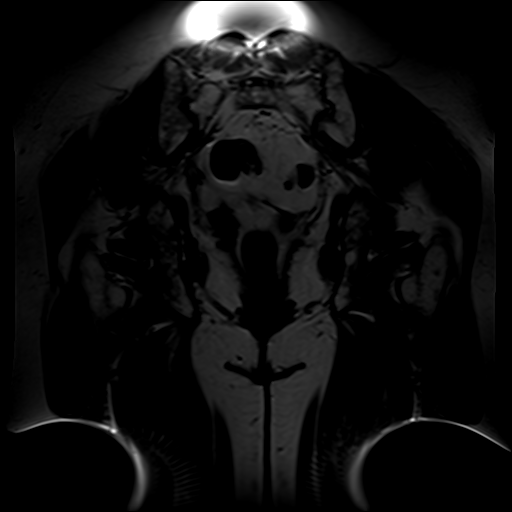
[im 14/28]
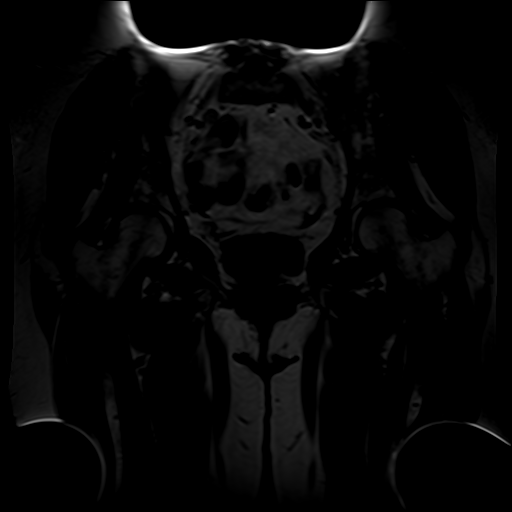
[im 19/28]
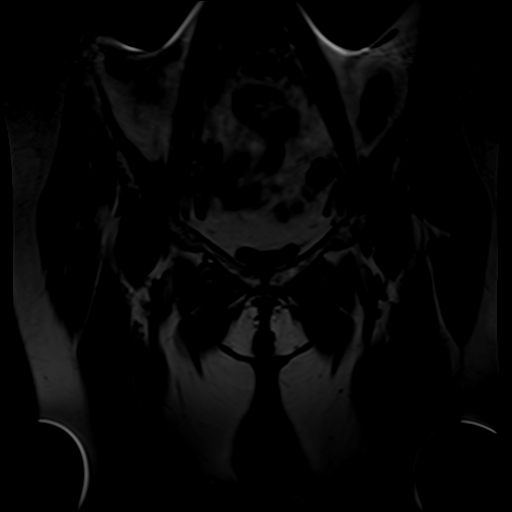
[im 23/28]
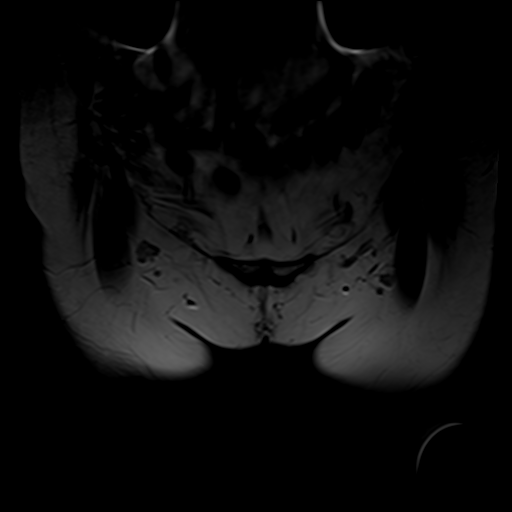
[im 28/28]
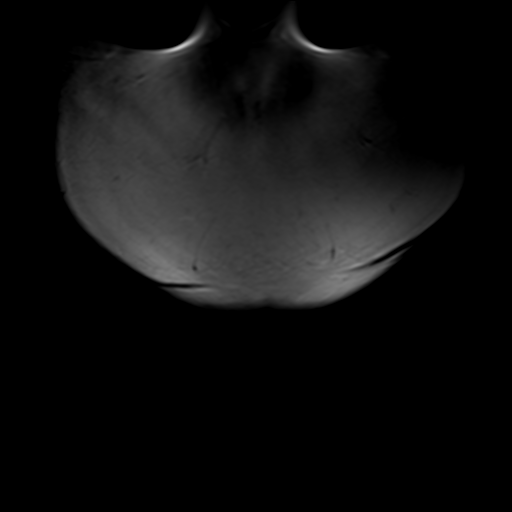

[Series 4: T2 fat-sat · coronal · 4.0mm · 0.76mm/px · 9 of 35 slices shown (1 of 2)]
[im 1/35]
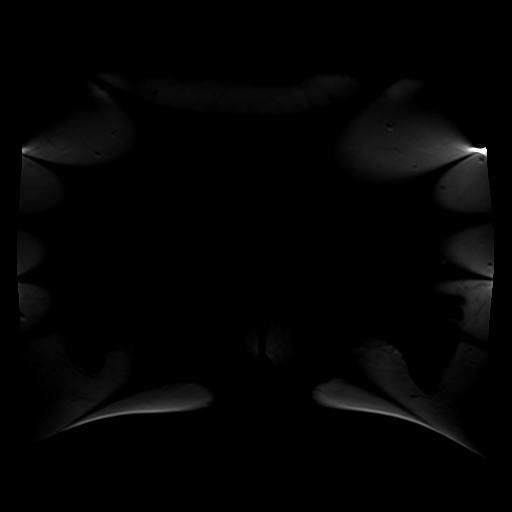
[im 5/35]
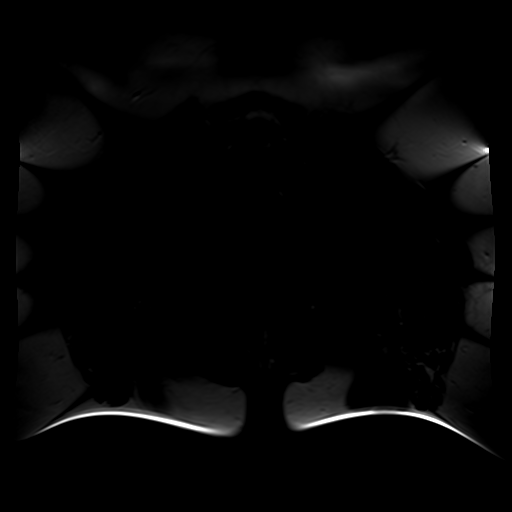
[im 9/35]
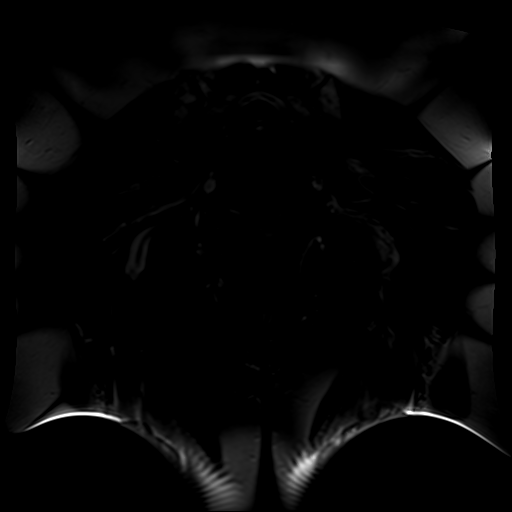
[im 13/35]
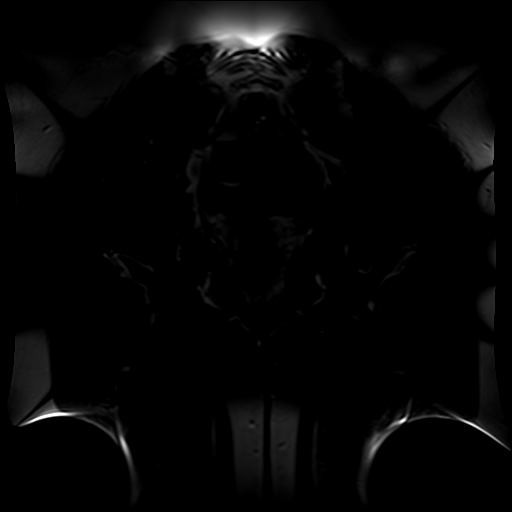
[im 18/35]
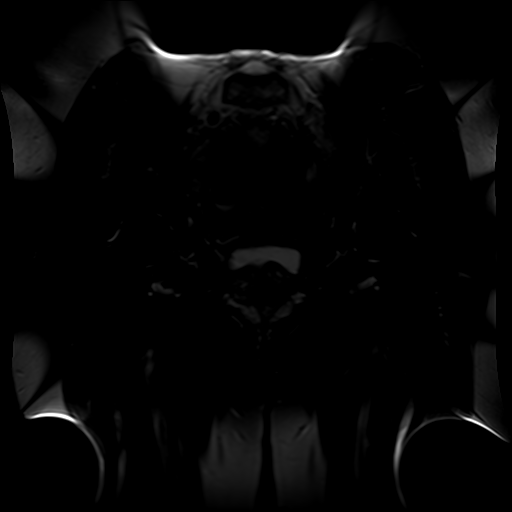
[im 22/35]
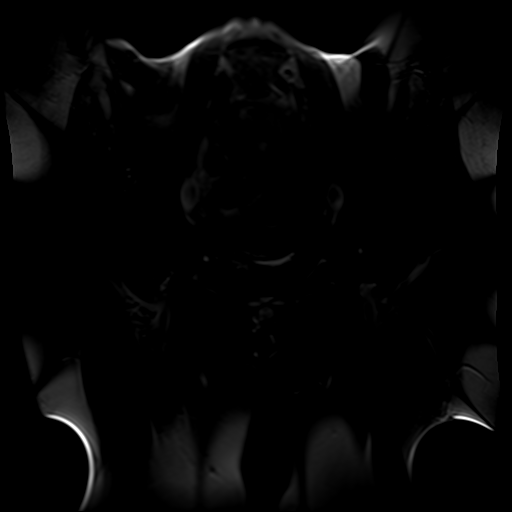
[im 26/35]
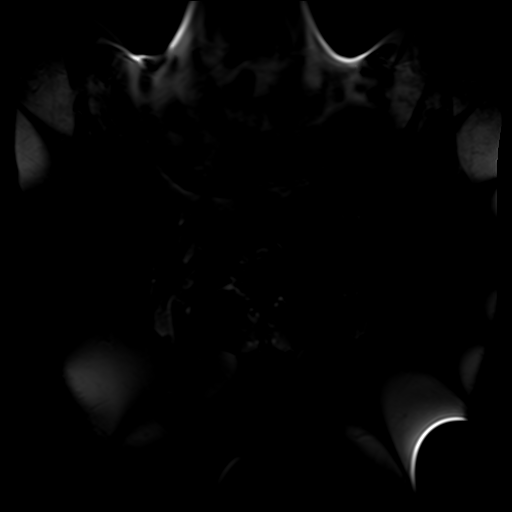
[im 30/35]
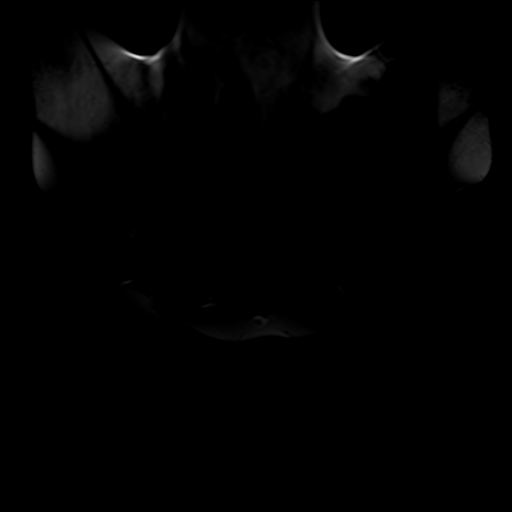
[im 35/35]
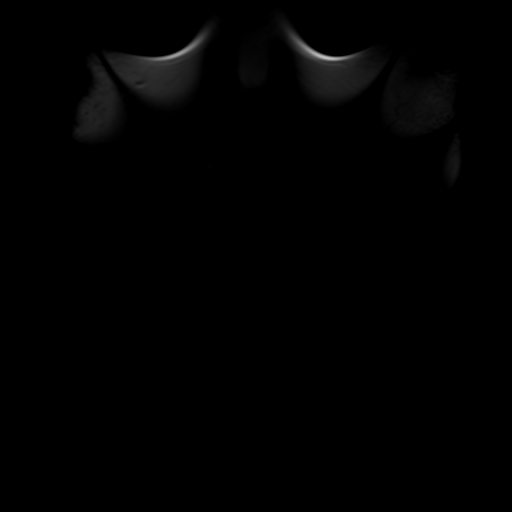

[Series 5: T2 fat-sat · axial · 4.0mm · 0.75mm/px · z∈[-73,+77]mm · 3 of 40 slices shown (2 of 2)]
[im 5/40]
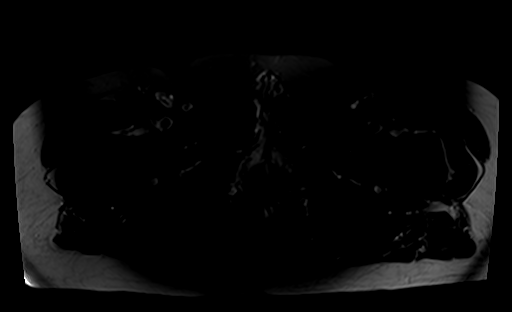
[im 22/40]
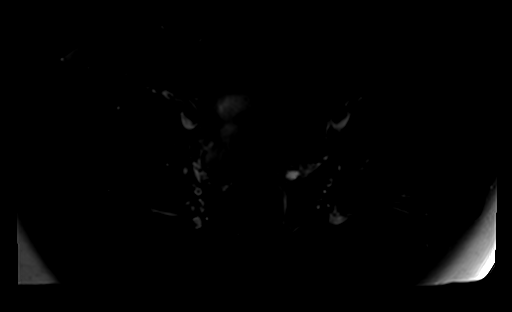
[im 35/40]
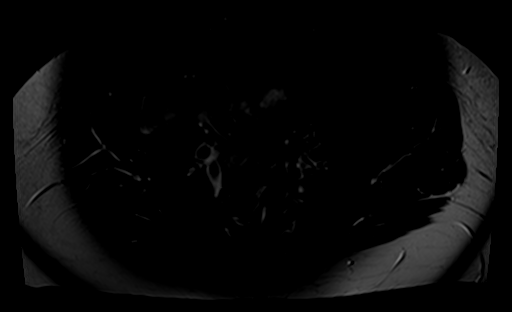

[Series 6: PD fat-sat · sagittal · 4.0mm · 0.82mm/px · 3 of 30 slices shown]
[im 5/30]
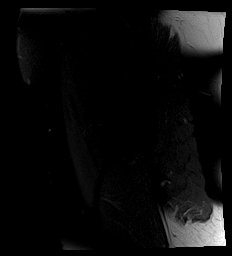
[im 17/30]
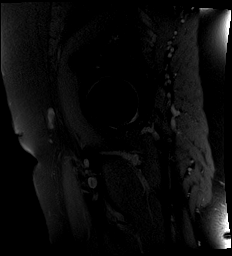
[im 25/30]
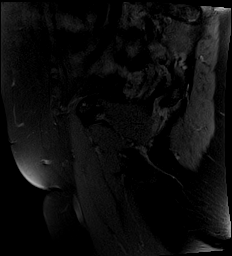

[22 of 40 positions shown; findings below may reference images not displayed]

FINDINGS: Bones: No acute fracture or avascular necrosis is seen within the
visualized portions of the pelvis or either proximal femur.
Mild-to-moderate joint space narrowing and peripheral osteophytosis
degenerative change of the pubic symphysis.

Articular cartilage and labrum

Articular cartilage: Right femoroacetabular mild-to-moderate
anterior superior cartilage thinning.

Labrum: Mild peripheral degenerative irregularity of the anterior
superior glenoid labrum (right hip coronal series 7 images 12
through 14).

On large field-of-view images there is mild-to-moderate thinning of
the superior left femoroacetabular cartilage.

Joint or bursal effusion

Joint effusion:  No joint effusion within either hip.

Bursae: No trochanteric bursitis on either side.

Muscles and tendons

Muscles and tendons: The origins of the bilateral sartorius rectus
femoris and common hamstring tendons are intact. There is moderate
intermediate T2 signal and thickening tendinosis of the right
gluteus medius tendon insertion (axial series 5, images 21 through
25). The bilateral gluteus minimus and left gluteus medius tendon
insertions are intact.

Other findings

Miscellaneous:   Mild-to-moderate sigmoid diverticulosis.
IMPRESSION: :
IMPRESSION: 1. Mild-to-moderate bilateral femoroacetabular cartilage thinning.
2. Moderate right gluteus medius insertional tendinosis.

## 2021-09-15 NOTE — Progress Notes (Signed)
[facility]  [address] [place],  [place]  [address] [phone]  Clinic Day:  09/16/2021  Referring physician: Jaclynn Major, NP   HISTORY OF PRESENT ILLNESS:  The patient is a 56 y.o. female  who I was asked to consult upon after a recent MRI of her lumbar spine suggested there may be hematologic reasons behind the changes within this region.  The patient claims that she has had back pain for numerous months which radiates down her right leg.  As she was getting no relief with therapy, an MRI of her lumbar spine was ordered.  The results of the study revealed a decreased marrow signal.  Although nonspecific, it was thought that such a finding could potentially be seen when anemia or some type of infiltrative marrow process is present.  The patient does have a history of blood clots, but denies having other hematologic issues which have ever alerted her to an underlying blood disorder being present.  To her knowledge, there is no family history of any hematologic disorders.  PAST MEDICAL HISTORY:   Past Medical History:  Diagnosis Date   Arthritis    History of DVT (deep vein thrombosis)    History of pulmonary embolus (PE)    History of simple renal cyst     PAST SURGICAL HISTORY:   Past Surgical History:  Procedure Laterality Date   ABDOMINAL HYSTERECTOMY     PARTIAL   ARTHROSCOPY KNEE W/ DRILLING Right 2009   INCONTINENCE SURGERY  2010   BLADDER SLING   NOSE SURGERY     WISDOM TOOTH EXTRACTION      CURRENT MEDICATIONS:   Current Outpatient Medications  Medication Sig Dispense Refill   aspirin 81 MG chewable tablet Chew by mouth.     WEGOVY 1 MG/0.5ML SOAJ Inject into the skin.     No current facility-administered medications for this visit.    ALLERGIES:   Allergies  Allergen Reactions   Sulfa Antibiotics Anaphylaxis   Vancomycin Itching    FAMILY HISTORY:   Family History  Problem Relation Age of Onset    Diabetes Mother    Pancreatic cancer Mother    Renal Disease Mother    Bladder Cancer Father    COPD Father    Emphysema Father    Lung cancer Sister    Prostate cancer Brother     SOCIAL HISTORY:  The patient was born and raised in [place], [place].  She currently lives in [place] with her husband of 66 years.  They have 3 children.  She worked numerous years for an [employer].  There is no history of alcoholism or tobacco abuse.  REVIEW OF SYSTEMS:  Review of Systems  Constitutional:  Negative for fatigue and fever.  HENT:   Negative for hearing loss and sore throat.   Eyes:  Negative for eye problems.  Respiratory:  Negative for chest tightness, cough and hemoptysis.   Cardiovascular:  Negative for chest pain and palpitations.  Gastrointestinal:  Positive for nausea. Negative for abdominal distention, abdominal pain, blood in stool, constipation, diarrhea and vomiting.  Endocrine: Negative for hot flashes.  Genitourinary:  Negative for difficulty urinating, dysuria, frequency, hematuria and nocturia.   Musculoskeletal:  Positive for arthralgias. Negative for back pain, gait problem and myalgias.  Skin:  Positive for itching. Negative for rash.  Neurological: Negative.  Negative for dizziness, extremity weakness, gait problem, headaches, light-headedness and numbness.  Hematological: Negative.   Psychiatric/Behavioral: Negative.  Negative for depression  and suicidal ideas. The patient is not nervous/anxious.     PHYSICAL EXAM:  Blood pressure 139/68, pulse 82, temperature 98.1 F (36.7 C), resp. rate 16, height 5' 4.75" (1.645 m), weight 163 lb 4.8 oz (74.1 kg), SpO2 97 %. Wt Readings from Last 3 Encounters:  09/16/21 163 lb 4.8 oz (74.1 kg)  03/19/21 193 lb 8 oz (87.8 kg)  10/31/20 188 lb 6.4 oz (85.5 kg)   Body mass index is 27.38 kg/m. Performance status (ECOG): 1 - Symptomatic but completely ambulatory Physical Exam Constitutional:      Appearance: Normal  appearance. She is not ill-appearing.  HENT:     Mouth/Throat:     Mouth: Mucous membranes are moist.     Pharynx: Oropharynx is clear. No oropharyngeal exudate or posterior oropharyngeal erythema.  Cardiovascular:     Rate and Rhythm: Normal rate and regular rhythm.     Heart sounds: No murmur heard.   No friction rub. No gallop.  Pulmonary:     Effort: Pulmonary effort is normal. No respiratory distress.     Breath sounds: Normal breath sounds. No wheezing, rhonchi or rales.  Abdominal:     General: Bowel sounds are normal. There is no distension.     Palpations: Abdomen is soft. There is no mass.     Tenderness: There is no abdominal tenderness.  Musculoskeletal:        General: No swelling.     Right lower leg: No edema.     Left lower leg: No edema.  Lymphadenopathy:     Cervical: No cervical adenopathy.     Upper Body:     Right upper body: No supraclavicular or axillary adenopathy.     Left upper body: No supraclavicular or axillary adenopathy.     Lower Body: No right inguinal adenopathy. No left inguinal adenopathy.  Skin:    General: Skin is warm.     Coloration: Skin is not jaundiced.     Findings: No lesion or rash.  Neurological:     General: No focal deficit present.     Mental Status: She is alert and oriented to person, place, and time. Mental status is at baseline.  Psychiatric:        Mood and Affect: Mood normal.        Behavior: Behavior normal.        Thought Content: Thought content normal.    LABS:       Latest Ref Rng & Units 09/16/2021   12:00 AM  CMP  BUN 4 - 21 14       Creatinine 0.5 - 1.1 0.7       Sodium 137 - 147 144       Potassium 3.5 - 5.1 mEq/L 3.8       Chloride 99 - 108 104       CO2 13 - 22 33       Calcium 8.7 - 10.7 8.9       Alkaline Phos 25 - 125 54       AST 13 - 35 30       ALT 7 - 35 U/L 26          This result is from an external source.   STUDIES:  MR LUMBAR SPINE WO CONTRAST  Result Date: 08/24/2021 CLINICAL  DATA:  Low back pain with right hip pain and right leg pain EXAM: MRI LUMBAR SPINE WITHOUT CONTRAST TECHNIQUE: Multiplanar, multisequence MR imaging of the lumbar spine  was performed. No intravenous contrast was administered. COMPARISON:  No prior MRI, correlation is made with lumbar spine radiographs 01/28/2021 FINDINGS: Segmentation:  Standard. Alignment:  Mild levocurvature.  No listhesis. Vertebrae: No acute fracture or suspicious osseous lesion. Diffusely decreased marrow signal. Conus medullaris and cauda equina: Conus extends to the L1-L2 level. Conus and cauda equina appear normal. Paraspinal and other soft tissues: Left parapelvic renal cyst. Disc levels: T12-L1: No significant disc bulge. No spinal canal stenosis or neural foraminal narrowing. L1-L2: No significant disc bulge. No spinal canal stenosis or neural foraminal narrowing. L2-L3: No significant disc bulge. No spinal canal stenosis or neural foraminal narrowing. L3-L4: No significant disc bulge. Mild facet arthropathy. No spinal canal stenosis or neural foraminal narrowing. L4-L5: No significant disc bulge. Moderate to severe facet arthropathy. No spinal canal stenosis. Narrowing of the bilateral lateral recesses. Mild bilateral neural foraminal narrowing. L5-S1: No significant disc bulge. No spinal canal stenosis. Mild left neural foraminal narrowing. IMPRESSION: 1. L4-L5 moderate to severe facet arthropathy, which can be a cause back pain, with mild bilateral neural foraminal narrowing. Narrowing of the lateral recess at this level could affect the descending L5 nerve roots. 2. L5-S1 mild left neural foraminal narrowing. 3. Diffusely decreased marrow signal, which is nonspecific; although this can be caused by an infiltrative marrow process, the most common causes include anemia, obesity, or tobacco use. Electronically Signed   By: Merilyn Baba M.D.   On: 08/24/2021 23:17   MR HIP RIGHT WO CONTRAST  Result Date: 08/24/2021 CLINICAL DATA:   Lower back and right hip and leg pain. Right leg weakness. Symptoms for 3 months. EXAM: MR OF THE RIGHT HIP WITHOUT CONTRAST TECHNIQUE: Multiplanar, multisequence MR imaging was performed. No intravenous contrast was administered. COMPARISON:  Lumbar spine radiographs 01/28/2021 FINDINGS: Bones: No acute fracture or avascular necrosis is seen within the visualized portions of the pelvis or either proximal femur. Mild-to-moderate joint space narrowing and peripheral osteophytosis degenerative change of the pubic symphysis. Articular cartilage and labrum Articular cartilage: Right femoroacetabular mild-to-moderate anterior superior cartilage thinning. Labrum: Mild peripheral degenerative irregularity of the anterior superior glenoid labrum (right hip coronal series 7 images 12 through 14). On large field-of-view images there is mild-to-moderate thinning of the superior left femoroacetabular cartilage. Joint or bursal effusion Joint effusion:  No joint effusion within either hip. Bursae: No trochanteric bursitis on either side. Muscles and tendons Muscles and tendons: The origins of the bilateral sartorius rectus femoris and common hamstring tendons are intact. There is moderate intermediate T2 signal and thickening tendinosis of the right gluteus medius tendon insertion (axial series 5, images 21 through 25). The bilateral gluteus minimus and left gluteus medius tendon insertions are intact. Other findings Miscellaneous:   Mild-to-moderate sigmoid diverticulosis. IMPRESSION:: IMPRESSION: 1. Mild-to-moderate bilateral femoroacetabular cartilage thinning. 2. Moderate right gluteus medius insertional tendinosis. Electronically Signed   By: Yvonne Kendall M.D.   On: 08/24/2021 21:53     ASSESSMENT & PLAN:  A 56 y.o. female who I was asked to consult upon based upon her recent lumbar MRI showing findings that could potentially reflect an underlying infiltrative bone marrow process or anemia.  When evaluating her labs  today, the patient's white count is only minimally low.  She clearly has more than enough neutrophils to ward off any spontaneous infection.  Her hemoglobin of 14.1 also highlights the fact that she is clearly not anemic, which rules out this diagnosis of being the reason behind her decreased bone marrow signal seen per recent  MRI.  In clinic today, I explained to the patient that her marrow changes on MRI are just too nonspecific to formulate any type of diagnosis that an underlying hematologic disorder is present.  There is nothing per her CBC today which suggests a bone marrow biopsy needs to be done for further evaluation.  I do feel comfortable turning her care back over to her primary care office.  My recommendation would be for her primary care office to check her CBC, at most, 1-2 times per year.  I would not have a problem evaluating her in the future if significant hematologic changes develop that require some form of assessment.  She understands that further MRIs would not impact my decision-making when it comes to anything hematologically related.  Only lab work, in particular, an abnormal CBC, would stimulate a more in-depth work-up.  The patient understands all the plans discussed today and is in agreement with them.  I do appreciate Jaclynn Major, NP for his new consult.   Reyna Lorenzi Macarthur Critchley, MD

## 2021-09-15 NOTE — Progress Notes (Incomplete)
[facility]  [address] [place],    [address] [phone]  Clinic Day:  09/15/2021  Referring physician: Elenore Paddy, NP   HISTORY OF PRESENT ILLNESS:  The patient is a 56 y.o. female  *** who I was asked to consult upon for ***   PAST MEDICAL HISTORY:   Past Medical History:  Diagnosis Date  . Arthritis     PAST SURGICAL HISTORY:   Past Surgical History:  Procedure Laterality Date  . ABDOMINAL HYSTERECTOMY    . ARTHROSCOPY KNEE W/ DRILLING Right 2009  . INCONTINENCE SURGERY  2010    CURRENT MEDICATIONS:   Current Outpatient Medications  Medication Sig Dispense Refill  . aspirin 81 MG chewable tablet Chew by mouth.    . Cholecalciferol 25 MCG (1000 UT) tablet Take by mouth.     No current facility-administered medications for this visit.    ALLERGIES:   Allergies  Allergen Reactions  . Sulfa Antibiotics Anaphylaxis  . Vancomycin Itching    FAMILY HISTORY:  No family history on file.  SOCIAL HISTORY:   reports that she has never smoked. She has never used smokeless tobacco. She reports that she does not drink alcohol and does not use drugs.  REVIEW OF SYSTEMS:  Review of Systems - Oncology   PHYSICAL EXAM:  There were no vitals taken for this visit. Wt Readings from Last 3 Encounters:  03/19/21 193 lb 8 oz (87.8 kg)  10/31/20 188 lb 6.4 oz (85.5 kg)   There is no height or weight on file to calculate BMI. Performance status (ECOG): {CHL ONC [id number] Physical Exam  LABS:       View : No data to display.             View : No data to display.           No results found for: CEA1 / No results found for: CEA1 No results found for: PSA1 No results found for: [id number] No results found for: CAN125  No results found for: TOTALPROTELP, ALBUMINELP, A1GS, A2GS, BETS, BETA2SER, GAMS, MSPIKE, SPEI No results found for: TIBC, FERRITIN, IRONPCTSAT No results found for: LDH  No results found  for: AFPTUMOR, TOTALPROTELP, ALBUMINELP, A1GS, A2GS, BETS, BETA2SER, GAMS, MSPIKE, SPEI, LDH, CEA1, PSA1, IGASERUM, IGGSERUM, IGMSERUM, THGAB, THYROGLB  Review Flowsheet         View : No data to display.              STUDIES:  MR LUMBAR SPINE WO CONTRAST  Result Date: 08/24/2021 CLINICAL DATA:  Low back pain with right hip pain and right leg pain EXAM: MRI LUMBAR SPINE WITHOUT CONTRAST TECHNIQUE: Multiplanar, multisequence MR imaging of the lumbar spine was performed. No intravenous contrast was administered. COMPARISON:  No prior MRI, correlation is made with lumbar spine radiographs 01/28/2021 FINDINGS: Segmentation:  Standard. Alignment:  Mild levocurvature.  No listhesis. Vertebrae: No acute fracture or suspicious osseous lesion. Diffusely decreased marrow signal. Conus medullaris and cauda equina: Conus extends to the L1-L2 level. Conus and cauda equina appear normal. Paraspinal and other soft tissues: Left parapelvic renal cyst. Disc levels: T12-L1: No significant disc bulge. No spinal canal stenosis or neural foraminal narrowing. L1-L2: No significant disc bulge. No spinal canal stenosis or neural foraminal narrowing. L2-L3: No significant disc bulge. No spinal canal stenosis or neural foraminal narrowing. L3-L4: No significant disc bulge. Mild facet arthropathy. No spinal canal stenosis or neural foraminal narrowing. L4-L5: No significant disc bulge.  Moderate to severe facet arthropathy. No spinal canal stenosis. Narrowing of the bilateral lateral recesses. Mild bilateral neural foraminal narrowing. L5-S1: No significant disc bulge. No spinal canal stenosis. Mild left neural foraminal narrowing. IMPRESSION: 1. L4-L5 moderate to severe facet arthropathy, which can be a cause back pain, with mild bilateral neural foraminal narrowing. Narrowing of the lateral recess at this level could affect the descending L5 nerve roots. 2. L5-S1 mild left neural foraminal narrowing. 3. Diffusely decreased  marrow signal, which is nonspecific; although this can be caused by an infiltrative marrow process, the most common causes include anemia, obesity, or tobacco use. Electronically Signed   By: Merilyn Baba M.D.   On: 08/24/2021 23:17   MR HIP RIGHT WO CONTRAST  Result Date: 08/24/2021 CLINICAL DATA:  Lower back and right hip and leg pain. Right leg weakness. Symptoms for 3 months. EXAM: MR OF THE RIGHT HIP WITHOUT CONTRAST TECHNIQUE: Multiplanar, multisequence MR imaging was performed. No intravenous contrast was administered. COMPARISON:  Lumbar spine radiographs 01/28/2021 FINDINGS: Bones: No acute fracture or avascular necrosis is seen within the visualized portions of the pelvis or either proximal femur. Mild-to-moderate joint space narrowing and peripheral osteophytosis degenerative change of the pubic symphysis. Articular cartilage and labrum Articular cartilage: Right femoroacetabular mild-to-moderate anterior superior cartilage thinning. Labrum: Mild peripheral degenerative irregularity of the anterior superior glenoid labrum (right hip coronal series 7 images 12 through 14). On large field-of-view images there is mild-to-moderate thinning of the superior left femoroacetabular cartilage. Joint or bursal effusion Joint effusion:  No joint effusion within either hip. Bursae: No trochanteric bursitis on either side. Muscles and tendons Muscles and tendons: The origins of the bilateral sartorius rectus femoris and common hamstring tendons are intact. There is moderate intermediate T2 signal and thickening tendinosis of the right gluteus medius tendon insertion (axial series 5, images 21 through 25). The bilateral gluteus minimus and left gluteus medius tendon insertions are intact. Other findings Miscellaneous:   Mild-to-moderate sigmoid diverticulosis. IMPRESSION:: IMPRESSION: 1. Mild-to-moderate bilateral femoroacetabular cartilage thinning. 2. Moderate right gluteus medius insertional tendinosis.  Electronically Signed   By: Yvonne Kendall M.D.   On: 08/24/2021 21:53     ASSESSMENT & PLAN:  A 56 y.o. female who I was asked to consult upon for *** .The patient understands all the plans discussed today and is in agreement with them.  I do appreciate Elenore Paddy, NP for his new consult.   Dariush Mcnellis Macarthur Critchley, MD

## 2021-09-16 ENCOUNTER — Encounter: Payer: Self-pay | Admitting: Oncology

## 2021-09-16 ENCOUNTER — Other Ambulatory Visit: Payer: Self-pay | Admitting: Oncology

## 2021-09-16 ENCOUNTER — Inpatient Hospital Stay: Payer: BC Managed Care – PPO | Attending: Oncology | Admitting: Oncology

## 2021-09-16 ENCOUNTER — Other Ambulatory Visit: Payer: Self-pay | Admitting: Hematology and Oncology

## 2021-09-16 ENCOUNTER — Inpatient Hospital Stay: Payer: BC Managed Care – PPO

## 2021-09-16 DIAGNOSIS — R937 Abnormal findings on diagnostic imaging of other parts of musculoskeletal system: Secondary | ICD-10-CM

## 2021-09-16 DIAGNOSIS — Z800 Family history of malignant neoplasm of digestive organs: Secondary | ICD-10-CM | POA: Diagnosis not present

## 2021-09-16 DIAGNOSIS — Z801 Family history of malignant neoplasm of trachea, bronchus and lung: Secondary | ICD-10-CM

## 2021-09-16 DIAGNOSIS — Z8052 Family history of malignant neoplasm of bladder: Secondary | ICD-10-CM

## 2021-09-16 DIAGNOSIS — Z862 Personal history of diseases of the blood and blood-forming organs and certain disorders involving the immune mechanism: Secondary | ICD-10-CM | POA: Diagnosis not present

## 2021-09-16 DIAGNOSIS — M6702 Short Achilles tendon (acquired), left ankle: Secondary | ICD-10-CM

## 2021-09-16 DIAGNOSIS — Z8042 Family history of malignant neoplasm of prostate: Secondary | ICD-10-CM

## 2021-09-16 LAB — BASIC METABOLIC PANEL
BUN: 14 (ref 4–21)
CO2: 33 — AB (ref 13–22)
Chloride: 104 (ref 99–108)
Creatinine: 0.7 (ref 0.5–1.1)
Glucose: 96
Potassium: 3.8 mEq/L (ref 3.5–5.1)
Sodium: 144 (ref 137–147)

## 2021-09-16 LAB — CBC
MCV: 92 (ref 81–99)
RBC: 4.62 (ref 3.87–5.11)

## 2021-09-16 LAB — HEPATIC FUNCTION PANEL
ALT: 26 U/L (ref 7–35)
AST: 30 (ref 13–35)
Alkaline Phosphatase: 54 (ref 25–125)
Bilirubin, Total: 0.7

## 2021-09-16 LAB — CBC AND DIFFERENTIAL
HCT: 43 (ref 36–46)
Hemoglobin: 14.1 (ref 12.0–16.0)
Neutrophils Absolute: 1.62
Platelets: 185 10*3/uL (ref 150–400)
WBC: 3.6

## 2021-09-16 LAB — COMPREHENSIVE METABOLIC PANEL
Albumin: 4.4 (ref 3.5–5.0)
Calcium: 8.9 (ref 8.7–10.7)

## 2021-09-21 DIAGNOSIS — R937 Abnormal findings on diagnostic imaging of other parts of musculoskeletal system: Secondary | ICD-10-CM | POA: Insufficient documentation

## 2021-10-20 DIAGNOSIS — M25551 Pain in right hip: Secondary | ICD-10-CM | POA: Diagnosis not present

## 2021-10-20 DIAGNOSIS — G8929 Other chronic pain: Secondary | ICD-10-CM | POA: Diagnosis not present

## 2021-10-20 DIAGNOSIS — R799 Abnormal finding of blood chemistry, unspecified: Secondary | ICD-10-CM | POA: Diagnosis not present

## 2021-10-20 DIAGNOSIS — M7918 Myalgia, other site: Secondary | ICD-10-CM | POA: Diagnosis not present

## 2021-10-20 DIAGNOSIS — M5441 Lumbago with sciatica, right side: Secondary | ICD-10-CM | POA: Diagnosis not present

## 2021-10-21 DIAGNOSIS — Z1231 Encounter for screening mammogram for malignant neoplasm of breast: Secondary | ICD-10-CM | POA: Diagnosis not present

## 2021-11-11 DIAGNOSIS — M5416 Radiculopathy, lumbar region: Secondary | ICD-10-CM | POA: Diagnosis not present

## 2021-11-11 DIAGNOSIS — Z6827 Body mass index (BMI) 27.0-27.9, adult: Secondary | ICD-10-CM | POA: Diagnosis not present

## 2021-11-11 DIAGNOSIS — G8929 Other chronic pain: Secondary | ICD-10-CM | POA: Diagnosis not present

## 2021-11-18 DIAGNOSIS — M1611 Unilateral primary osteoarthritis, right hip: Secondary | ICD-10-CM | POA: Diagnosis not present

## 2021-11-18 DIAGNOSIS — M1711 Unilateral primary osteoarthritis, right knee: Secondary | ICD-10-CM | POA: Diagnosis not present

## 2021-12-26 DIAGNOSIS — N390 Urinary tract infection, site not specified: Secondary | ICD-10-CM | POA: Diagnosis not present

## 2022-01-27 ENCOUNTER — Other Ambulatory Visit: Payer: Self-pay

## 2022-01-27 DIAGNOSIS — M25551 Pain in right hip: Secondary | ICD-10-CM | POA: Diagnosis not present

## 2022-01-27 DIAGNOSIS — E782 Mixed hyperlipidemia: Secondary | ICD-10-CM | POA: Diagnosis not present

## 2022-01-27 DIAGNOSIS — R8290 Unspecified abnormal findings in urine: Secondary | ICD-10-CM | POA: Diagnosis not present

## 2022-01-27 DIAGNOSIS — M25562 Pain in left knee: Secondary | ICD-10-CM | POA: Diagnosis not present

## 2022-01-27 DIAGNOSIS — E882 Lipomatosis, not elsewhere classified: Secondary | ICD-10-CM | POA: Diagnosis not present

## 2022-02-01 LAB — BASIC METABOLIC PANEL
BUN/Creatinine Ratio: 16 (ref 9–23)
BUN: 13 mg/dL (ref 6–24)
CO2: 23 mmol/L (ref 20–29)
Calcium: 9.3 mg/dL (ref 8.7–10.2)
Chloride: 103 mmol/L (ref 96–106)
Creatinine, Ser: 0.83 mg/dL (ref 0.57–1.00)
Glucose: 64 mg/dL — ABNORMAL LOW (ref 70–99)
Potassium: 4.3 mmol/L (ref 3.5–5.2)
Sodium: 141 mmol/L (ref 134–144)
eGFR: 82 mL/min/{1.73_m2} (ref >59)

## 2022-02-01 LAB — URINE CULTURE

## 2022-02-01 LAB — LIPID PANEL W/O CHOL/HDL RATIO
Cholesterol, Total: 231 mg/dL — ABNORMAL HIGH (ref 100–199)
HDL: 57 mg/dL (ref >39)
LDL Chol Calc (NIH): 161 mg/dL — ABNORMAL HIGH (ref 0–99)
Triglycerides: 77 mg/dL (ref 0–149)
VLDL Cholesterol Cal: 13 mg/dL (ref 5–40)

## 2022-02-01 LAB — SPECIMEN STATUS REPORT

## 2022-02-01 LAB — VITAMIN B12: Vitamin B-12: 412 pg/mL (ref 232–1245)

## 2022-04-13 ENCOUNTER — Ambulatory Visit: Payer: BC Managed Care – PPO | Admitting: Internal Medicine

## 2022-05-11 ENCOUNTER — Ambulatory Visit: Payer: BC Managed Care – PPO | Admitting: Internal Medicine

## 2022-05-11 ENCOUNTER — Encounter: Payer: Self-pay | Admitting: Internal Medicine

## 2022-05-11 VITALS — BP 118/80 | HR 82 | Temp 97.8°F | Resp 18 | Ht 64.0 in | Wt 168.2 lb

## 2022-05-11 DIAGNOSIS — U099 Post covid-19 condition, unspecified: Secondary | ICD-10-CM | POA: Insufficient documentation

## 2022-05-11 DIAGNOSIS — G4700 Insomnia, unspecified: Secondary | ICD-10-CM | POA: Diagnosis not present

## 2022-05-11 DIAGNOSIS — F419 Anxiety disorder, unspecified: Secondary | ICD-10-CM | POA: Diagnosis not present

## 2022-05-11 DIAGNOSIS — R140 Abdominal distension (gaseous): Secondary | ICD-10-CM | POA: Insufficient documentation

## 2022-05-11 DIAGNOSIS — R350 Frequency of micturition: Secondary | ICD-10-CM | POA: Diagnosis not present

## 2022-05-11 LAB — POCT URINALYSIS DIPSTICK
Bilirubin, UA: NEGATIVE
Blood, UA: NEGATIVE
Glucose, UA: NEGATIVE
Ketones, UA: NEGATIVE
Leukocytes, UA: NEGATIVE
Nitrite, UA: NEGATIVE
Protein, UA: NEGATIVE
Spec Grav, UA: 1.01 (ref 1.010–1.025)
Urobilinogen, UA: 0.2 E.U./dL
pH, UA: 6.5 (ref 5.0–8.0)

## 2022-05-11 MED ORDER — GABAPENTIN 300 MG PO CAPS: 300.0000 mg | ORAL_CAPSULE | Freq: Two times a day (BID) | ORAL | 2 refills | Status: DC

## 2022-05-11 NOTE — Assessment & Plan Note (Signed)
I will start her gabapentin to see if her symptoms are better.

## 2022-05-11 NOTE — Progress Notes (Addendum)
Office Visit  Subjective   Patient ID: Jacqueline Willis   DOB: [birth year]   Age: 57 y.o.   MRN: [id number]   Chief Complaint Chief Complaint  Patient presents with   Hyperlipidemia   Follow-up     History of Present Illness Patient is a 57 year old Caucasian/White female who presents today for follow up.  She says that she has warm and hissing feeling in his right ear.  She also is complaining of anxiety and lack of sleep.  She only sleeps interrupted 5 to 6 hours.  She admits drinking a lot of water in the evening because of intermittent fasting.  She is still tired and fatigued and feel like she has no energy.  She wanted to know if she has any inflammation in her body.  She also has a brain fog and fatigue and brain fog started after she has a COVID infection.  She also has a weight gain and she was started on Wegovy but in spite of that she was gaining weight so she stopped that and restart taking phentermine back.  She has gained 11 pounds since January 27, 2022. She had been watching her diet but could not do exercise because she stayed tired all the time.  Her BMI today is 28.  Jacqueline Willis returns today for routine followup on her cholesterol. Overall, she states she is doing well and is without any complaints or problems at this time. She specifically denies chest pain, abdominal pain, nausea, diarrhea, and myalgias. She remains on dietary management alone for intervention and claims to be adhering well to it thus far. She is fasting in anticipation for labs today.      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  She feels that she may have a urinary tract infection and she has given a sample of urine to be evaluated.      Past Medical History Past Medical History:  Diagnosis Date   Arthritis    History of DVT (deep vein thrombosis)    History of pulmonary embolus (PE)    History of simple renal cyst      Allergies Allergies  Allergen  Reactions   Sulfa Antibiotics Anaphylaxis   Vancomycin Itching     Review of Systems Review of Systems  Constitutional:  Positive for malaise/fatigue.  HENT:  Positive for tinnitus.   Respiratory: Negative.    Cardiovascular: Negative.   Genitourinary:  Positive for frequency.  Neurological: Negative.   Psychiatric/Behavioral:  The patient is nervous/anxious and has insomnia.        Objective:    Vitals BP 118/80 (BP Location: Left Arm, Patient Position: Sitting, Cuff Size: Normal)   Pulse 82   Temp 97.8 F (36.6 C)   Resp 18   Ht 5' 4"$  (1.626 m)   Wt 168 lb 4 oz (76.3 kg)   SpO2 98%   BMI 28.88 kg/m    Physical Examination Physical Exam Constitutional:      Appearance: Normal appearance. She is obese.  HENT:     Head: Normocephalic and atraumatic.  Eyes:     Extraocular Movements: Extraocular movements intact.     Pupils: Pupils are equal, round, and reactive to light.  Cardiovascular:     Rate and Rhythm: Normal rate and regular rhythm.     Heart sounds: Normal heart sounds.  Pulmonary:     Effort: Pulmonary effort is normal.     Breath sounds: Normal breath sounds.  Abdominal:     General: Bowel sounds are normal.     Palpations: Abdomen is soft.  Neurological:     General: No focal deficit present.     Mental Status: She is alert and oriented to person, place, and time.        Assessment & Plan:   Insomnia I will start her gabapentin to see if her symptoms are better.   Anxiety She will do exercise and Yoga to help reduce anxiety if not better then will start dulexetine that will also help with her chronic pain.   Post-COVID syndrome Fatigue and brain fog are COVID symptoms and can last many months.     Return in about 1 month (around 06/11/2022).   Garwin Brothers, MD

## 2022-05-11 NOTE — Addendum Note (Signed)
Addended by: Yanett Conkright on: 05/11/2022 04:26 PM   Modules accepted: Orders

## 2022-05-11 NOTE — Assessment & Plan Note (Signed)
Fatigue and brain fog are COVID symptoms and can last many months.

## 2022-05-11 NOTE — Assessment & Plan Note (Addendum)
She will do exercise and Yoga to help reduce anxiety if not better then will start dulexetine that will also help with her chronic pain.

## 2022-05-12 DIAGNOSIS — L719 Rosacea, unspecified: Secondary | ICD-10-CM | POA: Diagnosis not present

## 2022-05-12 DIAGNOSIS — R220 Localized swelling, mass and lump, head: Secondary | ICD-10-CM | POA: Diagnosis not present

## 2022-05-12 DIAGNOSIS — R42 Dizziness and giddiness: Secondary | ICD-10-CM | POA: Diagnosis not present

## 2022-05-12 DIAGNOSIS — H9311 Tinnitus, right ear: Secondary | ICD-10-CM | POA: Diagnosis not present

## 2022-05-12 DIAGNOSIS — L728 Other follicular cysts of the skin and subcutaneous tissue: Secondary | ICD-10-CM | POA: Diagnosis not present

## 2022-05-12 NOTE — Progress Notes (Signed)
Mailed letter

## 2022-05-15 DIAGNOSIS — S00431A Contusion of right ear, initial encounter: Secondary | ICD-10-CM | POA: Diagnosis not present

## 2022-05-15 DIAGNOSIS — H9311 Tinnitus, right ear: Secondary | ICD-10-CM | POA: Diagnosis not present

## 2022-05-15 DIAGNOSIS — R42 Dizziness and giddiness: Secondary | ICD-10-CM | POA: Diagnosis not present

## 2022-06-08 DIAGNOSIS — H905 Unspecified sensorineural hearing loss: Secondary | ICD-10-CM | POA: Diagnosis not present

## 2022-06-08 DIAGNOSIS — H9311 Tinnitus, right ear: Secondary | ICD-10-CM | POA: Diagnosis not present

## 2022-06-08 DIAGNOSIS — H9042 Sensorineural hearing loss, unilateral, left ear, with unrestricted hearing on the contralateral side: Secondary | ICD-10-CM | POA: Diagnosis not present

## 2022-06-15 ENCOUNTER — Encounter: Payer: Self-pay | Admitting: Internal Medicine

## 2022-06-15 ENCOUNTER — Ambulatory Visit: Payer: BC Managed Care – PPO | Admitting: Internal Medicine

## 2022-06-15 VITALS — BP 122/80 | HR 59 | Temp 98.0°F | Resp 18 | Ht 64.0 in | Wt 156.2 lb

## 2022-06-15 DIAGNOSIS — Z6826 Body mass index (BMI) 26.0-26.9, adult: Secondary | ICD-10-CM | POA: Insufficient documentation

## 2022-06-15 DIAGNOSIS — Z86711 Personal history of pulmonary embolism: Secondary | ICD-10-CM | POA: Insufficient documentation

## 2022-06-15 DIAGNOSIS — E785 Hyperlipidemia, unspecified: Secondary | ICD-10-CM | POA: Insufficient documentation

## 2022-06-15 DIAGNOSIS — Z86718 Personal history of other venous thrombosis and embolism: Secondary | ICD-10-CM | POA: Insufficient documentation

## 2022-06-15 DIAGNOSIS — F419 Anxiety disorder, unspecified: Secondary | ICD-10-CM

## 2022-06-15 NOTE — Assessment & Plan Note (Signed)
Better so will continue to monitor.

## 2022-06-15 NOTE — Progress Notes (Signed)
   Office Visit  Subjective   Patient ID: Jacqueline Willis   DOB: 1966/04/17   Age: 57 y.o.   MRN: [id number]   Chief Complaint Chief Complaint  Patient presents with   Follow-up    Anxiety Insomnia     History of Present Illness 57 years old female is here for follow up. She has lost 6 more pounds since last visit. She says she is taking phentermine 37.5 mg daily, she is watching her diet, doing intermittent fasting and doing exercise. No side effects from medicine. She filled her prescription on 2/6 and is not due until next month.  She also says that she could not take gabapentine as it made her feel that she did not like it. She is sleeping better and her anxiety is also better.   Past Medical History Past Medical History:  Diagnosis Date   Arthritis    History of DVT (deep vein thrombosis)    History of pulmonary embolus (PE)    History of simple renal cyst      Allergies Allergies  Allergen Reactions   Sulfa Antibiotics Anaphylaxis   Vancomycin Itching     Review of Systems Review of Systems  Constitutional: Negative.   HENT: Negative.    Respiratory: Negative.    Cardiovascular: Negative.   Gastrointestinal: Negative.   Neurological: Negative.        Objective:    Vitals BP 122/80 (BP Location: Left Arm, Patient Position: Sitting, Cuff Size: Normal)   Pulse (!) 59   Temp 98 F (36.7 C)   Resp 18   Ht 5' 4"$  (1.626 m)   Wt 156 lb 4 oz (70.9 kg)   SpO2 99%   BMI 26.82 kg/m    Physical Examination Physical Exam Constitutional:      Appearance: Normal appearance.  HENT:     Head: Normocephalic and atraumatic.  Cardiovascular:     Rate and Rhythm: Normal rate and regular rhythm.     Heart sounds: Normal heart sounds.  Pulmonary:     Effort: Pulmonary effort is normal.     Breath sounds: Normal breath sounds.  Neurological:     General: No focal deficit present.     Mental Status: She is alert and oriented to person, place, and time.         Assessment & Plan:   BMI 26.0-26.9,adult Continue with exercise, watch her diet and will continue with phentermine. I will send refill next month.   Dyslipidemia She will continue to watch her diet and will repeat lipid panel on next visit.   Anxiety Better so will continue to monitor.    Return in about 3 months (around 09/13/2022).   Garwin Brothers, MD

## 2022-06-15 NOTE — Progress Notes (Signed)
   Office Visit  Subjective   Patient ID: Jacqueline Willis   DOB: 12-03-1965   Age: 57 y.o.   MRN: [id number]   Chief Complaint Chief Complaint  Patient presents with   Follow-up    Anxiety Insomnia     History of Present Illness HPI   Past Medical History Past Medical History:  Diagnosis Date   Arthritis    History of DVT (deep vein thrombosis)    History of pulmonary embolus (PE)    History of simple renal cyst      Allergies Allergies  Allergen Reactions   Sulfa Antibiotics Anaphylaxis   Vancomycin Itching     Review of Systems Review of Systems  Constitutional: Negative.   HENT: Negative.    Respiratory: Negative.    Cardiovascular: Negative.   Gastrointestinal: Negative.   Neurological: Negative.        Objective:    Vitals BP 122/80 (BP Location: Left Arm, Patient Position: Sitting, Cuff Size: Normal)   Pulse (!) 59   Temp 98 F (36.7 C)   Resp 18   Ht 5' 4"$  (1.626 m)   Wt 156 lb 4 oz (70.9 kg)   SpO2 99%   BMI 26.82 kg/m    Physical Examination Physical Exam     Assessment & Plan:   No problem-specific Assessment & Plan notes found for this encounter.    No follow-ups on file.   Garwin Brothers, MD

## 2022-06-15 NOTE — Assessment & Plan Note (Signed)
Continue with exercise, watch her diet and will continue with phentermine. I will send refill next month.

## 2022-06-15 NOTE — Assessment & Plan Note (Signed)
She will continue to watch her diet and will repeat lipid panel on next visit.

## 2022-07-15 ENCOUNTER — Other Ambulatory Visit: Payer: Self-pay | Admitting: Internal Medicine

## 2022-07-15 MED ORDER — PHENTERMINE HCL 37.5 MG PO TABS: 37.5000 mg | ORAL_TABLET | Freq: Every day | ORAL | 1 refills | Status: DC

## 2022-08-01 DIAGNOSIS — K5900 Constipation, unspecified: Secondary | ICD-10-CM | POA: Diagnosis not present

## 2022-08-01 DIAGNOSIS — R1030 Lower abdominal pain, unspecified: Secondary | ICD-10-CM | POA: Diagnosis not present

## 2022-08-01 DIAGNOSIS — K5730 Diverticulosis of large intestine without perforation or abscess without bleeding: Secondary | ICD-10-CM | POA: Diagnosis not present

## 2022-08-01 DIAGNOSIS — R1013 Epigastric pain: Secondary | ICD-10-CM | POA: Diagnosis not present

## 2022-08-01 DIAGNOSIS — R3989 Other symptoms and signs involving the genitourinary system: Secondary | ICD-10-CM | POA: Diagnosis not present

## 2022-08-01 DIAGNOSIS — Z7982 Long term (current) use of aspirin: Secondary | ICD-10-CM | POA: Diagnosis not present

## 2022-08-01 DIAGNOSIS — R079 Chest pain, unspecified: Secondary | ICD-10-CM | POA: Diagnosis not present

## 2022-08-01 DIAGNOSIS — R1084 Generalized abdominal pain: Secondary | ICD-10-CM | POA: Diagnosis not present

## 2022-08-01 DIAGNOSIS — R9431 Abnormal electrocardiogram [ECG] [EKG]: Secondary | ICD-10-CM | POA: Diagnosis not present

## 2022-08-01 DIAGNOSIS — Z79899 Other long term (current) drug therapy: Secondary | ICD-10-CM | POA: Diagnosis not present

## 2022-08-01 DIAGNOSIS — R109 Unspecified abdominal pain: Secondary | ICD-10-CM | POA: Diagnosis not present

## 2022-08-02 DIAGNOSIS — R079 Chest pain, unspecified: Secondary | ICD-10-CM | POA: Diagnosis not present

## 2022-08-02 DIAGNOSIS — R1013 Epigastric pain: Secondary | ICD-10-CM | POA: Diagnosis not present

## 2022-08-02 DIAGNOSIS — K5900 Constipation, unspecified: Secondary | ICD-10-CM | POA: Diagnosis not present

## 2022-08-03 DIAGNOSIS — R079 Chest pain, unspecified: Secondary | ICD-10-CM | POA: Diagnosis not present

## 2022-08-03 DIAGNOSIS — R1013 Epigastric pain: Secondary | ICD-10-CM | POA: Diagnosis not present

## 2022-08-03 DIAGNOSIS — K5900 Constipation, unspecified: Secondary | ICD-10-CM | POA: Diagnosis not present

## 2022-08-05 ENCOUNTER — Inpatient Hospital Stay: Payer: BC Managed Care – PPO | Admitting: Internal Medicine

## 2022-08-05 ENCOUNTER — Encounter: Payer: Self-pay | Admitting: Internal Medicine

## 2022-08-05 ENCOUNTER — Ambulatory Visit: Payer: BC Managed Care – PPO | Admitting: Internal Medicine

## 2022-08-05 VITALS — BP 126/78 | HR 82 | Temp 97.6°F | Resp 16 | Ht 64.0 in | Wt 152.6 lb

## 2022-08-05 DIAGNOSIS — R1013 Epigastric pain: Secondary | ICD-10-CM | POA: Insufficient documentation

## 2022-08-05 NOTE — Assessment & Plan Note (Signed)
I agree with the hospitalists that this sounds like it is GI in nature.  She has now noted that her pain is associated after she eats.  She could have gastritis or even an ulcer.  Continue on protonix and we will refer her to GI for evaluation.

## 2022-08-05 NOTE — Progress Notes (Signed)
Office Visit  Subjective   Patient ID: Jacqueline Willis   DOB: 12/16/1965   Age: 57 y.o.   MRN: [id number]   Chief Complaint Chief Complaint  Patient presents with   Hospitalization Follow-up     History of Present Illness The patient is a 57 yo female who comes in today for a hospital followup.  She was in the hospital from 4/6 until 4/8 where she presented to the ER with complaints of several day history of abdominal and chest pain.  There was no associated SOB or radiation.  Her troponins were negative and the hospitalists felt her symptoms were due to a GI cause.  There were no other GI symptoms with no nausea, vomiting, diarrhea or constipation.  She underwent a CTA of the chest, abd/pelvis and this was unremarkable except for some irregular plaques in her SMA and celiac arteries without any high grade stenosis.  She underwent an ECHO which showed a normal EF.  She also underwent stress testing which showed no reversible ischemia and a preserved EF.  They suspected her symptoms were GI related due to rapid weight loss and they placed her on protonix 40mg  daily x 2 months.  Today, she states she has no more chest pain but she still has some mild epigastric pain. This pain does occur after she eats.  She was also treated for a UTI during her hospitalization.     Past Medical History Past Medical History:  Diagnosis Date   Arthritis    History of DVT (deep vein thrombosis)    History of pulmonary embolus (PE)    History of simple renal cyst      Allergies Allergies  Allergen Reactions   Sulfa Antibiotics Anaphylaxis   Vancomycin Itching     Medications  Current Outpatient Medications:    cefdinir (OMNICEF) 300 MG capsule, Take 300 mg by mouth 2 (two) times daily., Disp: , Rfl:    pantoprazole (PROTONIX) 40 MG tablet, Take 40 mg by mouth daily., Disp: , Rfl:    aspirin 81 MG chewable tablet, Chew by mouth., Disp: , Rfl:    phentermine (ADIPEX-P) 37.5 MG tablet, Take 1 tablet  (37.5 mg total) by mouth daily before breakfast., Disp: 30 tablet, Rfl: 1   Review of Systems Review of Systems  Constitutional:  Negative for chills and fever.  Respiratory:  Negative for cough and shortness of breath.   Cardiovascular:  Negative for chest pain, palpitations and leg swelling.  Gastrointestinal:  Positive for abdominal pain and constipation. Negative for blood in stool, diarrhea, heartburn, melena, nausea and vomiting.  Genitourinary:  Negative for dysuria and frequency.  Neurological:  Negative for dizziness, weakness and headaches.       Objective:    Vitals BP 126/78 (BP Location: Right Arm, Patient Position: Sitting, Cuff Size: Normal)   Pulse 82   Temp 97.6 F (36.4 C) (Temporal)   Resp 16   Ht 5\' 4"  (1.626 m)   Wt 152 lb 9.6 oz (69.2 kg)   SpO2 93%   BMI 26.19 kg/m    Physical Examination Physical Exam Constitutional:      Appearance: Normal appearance. She is not ill-appearing.  Cardiovascular:     Rate and Rhythm: Normal rate and regular rhythm.     Pulses: Normal pulses.     Heart sounds: No murmur heard.    No friction rub. No gallop.  Pulmonary:     Effort: Pulmonary effort is normal. No respiratory distress.  Breath sounds: No wheezing, rhonchi or rales.  Abdominal:     General: Bowel sounds are normal. There is no distension.     Palpations: Abdomen is soft.     Tenderness: There is no abdominal tenderness.  Musculoskeletal:     Right lower leg: No edema.     Left lower leg: No edema.  Skin:    General: Skin is warm and dry.     Findings: Lesion present. No rash.  Neurological:     Mental Status: She is alert.        Assessment & Plan:   Epigastric pain I agree with the hospitalists that this sounds like it is GI in nature.  She has now noted that her pain is associated after she eats.  She could have gastritis or even an ulcer.  Continue on protonix and we will refer her to GI for evaluation.    No follow-ups on file.    Crist Fat, MD

## 2022-08-24 ENCOUNTER — Encounter: Payer: Self-pay | Admitting: Internal Medicine

## 2022-08-24 ENCOUNTER — Ambulatory Visit: Payer: BC Managed Care – PPO | Admitting: Internal Medicine

## 2022-08-24 VITALS — BP 138/70 | HR 84 | Temp 98.1°F | Resp 16 | Ht 64.0 in | Wt 149.6 lb

## 2022-08-24 DIAGNOSIS — E882 Lipomatosis, not elsewhere classified: Secondary | ICD-10-CM

## 2022-08-24 DIAGNOSIS — Z86718 Personal history of other venous thrombosis and embolism: Secondary | ICD-10-CM | POA: Diagnosis not present

## 2022-08-24 DIAGNOSIS — I872 Venous insufficiency (chronic) (peripheral): Secondary | ICD-10-CM | POA: Diagnosis not present

## 2022-08-24 DIAGNOSIS — E7800 Pure hypercholesterolemia, unspecified: Secondary | ICD-10-CM | POA: Diagnosis not present

## 2022-08-24 DIAGNOSIS — Z86711 Personal history of pulmonary embolism: Secondary | ICD-10-CM

## 2022-08-24 DIAGNOSIS — Z0000 Encounter for general adult medical examination without abnormal findings: Secondary | ICD-10-CM

## 2022-08-24 DIAGNOSIS — G4733 Obstructive sleep apnea (adult) (pediatric): Secondary | ICD-10-CM

## 2022-08-24 DIAGNOSIS — M545 Low back pain, unspecified: Secondary | ICD-10-CM

## 2022-08-24 DIAGNOSIS — Z6825 Body mass index (BMI) 25.0-25.9, adult: Secondary | ICD-10-CM

## 2022-08-24 DIAGNOSIS — K760 Fatty (change of) liver, not elsewhere classified: Secondary | ICD-10-CM | POA: Diagnosis not present

## 2022-08-24 DIAGNOSIS — M1711 Unilateral primary osteoarthritis, right knee: Secondary | ICD-10-CM

## 2022-08-24 DIAGNOSIS — K219 Gastro-esophageal reflux disease without esophagitis: Secondary | ICD-10-CM

## 2022-08-24 MED ORDER — PHENTERMINE HCL 37.5 MG PO TABS: 37.5000 mg | ORAL_TABLET | Freq: Every day | ORAL | 1 refills | Status: DC

## 2022-08-24 NOTE — Assessment & Plan Note (Signed)
This disease state is noted.

## 2022-08-24 NOTE — Assessment & Plan Note (Signed)
She has had a hematology workup and per the notes, she does not have a hypercoagulable state.

## 2022-08-24 NOTE — Assessment & Plan Note (Signed)
She has been referred to GI for evaluation.  She takes her protonix as needed.

## 2022-08-24 NOTE — Assessment & Plan Note (Signed)
She is to try to wear compression hose and elevated her legs.  This is not really a problem today.

## 2022-08-24 NOTE — Assessment & Plan Note (Signed)
She is not having problems with her back at this time.  She can use tylenol as needed.

## 2022-08-24 NOTE — Assessment & Plan Note (Signed)
She has had a hematology workup and per the notes, she does not have a hypercoagulable state. 

## 2022-08-24 NOTE — Assessment & Plan Note (Signed)
She will follow up with ortho for care with her right knee.

## 2022-08-24 NOTE — Assessment & Plan Note (Signed)
We will check her FLP today. 

## 2022-08-24 NOTE — Assessment & Plan Note (Signed)
Health maintenance discussed.  We will obtain a mammogram later this year.  We will obtain yearly labs.

## 2022-08-24 NOTE — Assessment & Plan Note (Signed)
We will give her another 2 months of adipex.  I asked her to do lifestyle modfication and start exercising.  I want her to continue to eat healthy.  Our goal will be for her to lose 2-3 lbs per month while on adipex.

## 2022-08-24 NOTE — Progress Notes (Signed)
Office Visit  Subjective   Patient ID: Jacqueline Willis   DOB: Jun 27, 1965   Age: 57 y.o.   MRN: [id number]   Chief Complaint Chief Complaint  Patient presents with   Annual Exam     History of Present Illness Jacqueline Willis is a 57 year old Caucasian/White female who presents for her annual health maintenance exam. She is due for the following health maintenance studies: mammogram and screening labs. This patient's past medical history Chronic Venous Insufficiency, Deep Vein Thrombosis, Elevated Cholesterol, Embolism, Fatty Liver Disease, Osteoarthritis, Sleep Apnea, Obstructive, Varicose Veins, and Vitamin D deficiency.   Her last eye exam was done 1 year ago and sometimes her vision is blurred. She is going to make another appointment with the eye doctor.  There is a family history of colorectal cancer in her father but no family history of breast cancer.  Her last colonoscopy was done in [place] in 2020 and she states it was normal and she was told to come back in 5 years.  Her last digital screening mammogram was done on 10/21/2021 and this showed retropectoral implants and no findings suspicious for malignancy.  She had a partial hysterectomy in 1998.  She has never smoked.  She does not exercise regularly.  The patient does not get yearly flu vaccines.  In 2020, she was given the flu vaccine and pneumovax 23 at same time and she ended up passing out and vomiting.  They do not know which shot caused this reaction but she was in the hospital for 3 days.  The patient has never had a shingles vaccine.  The patient has had 3 COVID-19 vaccines including 1 booster.  There is a family of MI in her grandmother in her 83's and a stroke in the same grandmother in her 17's.  Her paternal grandfather had a MI in his 50's as well.  The patient is on an ASA 81mg  daily.  The patient was recently hospitalized this past month from 08/01/2022 until 08/03/2022 where she presented to the ER with complaints of  several day history of abdominal and chest pain.  There was no associated SOB or radiation.  Her troponins were negative and the hospitalists felt her symptoms were due to a GI cause.  There were no other GI symptoms with no nausea, vomiting, diarrhea or constipation.  She underwent a CTA of the chest, abd/pelvis and this was unremarkable except for some irregular plaques in her SMA and celiac arteries without any high grade stenosis.  She underwent an ECHO which showed a normal LVEF of 60-65% and her LV diastolic function was undetermined. She also underwent stress testing which showed no reversible ischemia and a preserved EF.  They suspected her symptoms were GI related due to rapid weight loss and they placed her on protonix 40mg  daily x 2 months.  Today, she states she has no more chest pain or epigastric pain. This pain dud occur after she ate,  She admits to me today that she does not take her protonix as directed.  We did set her up for a GI evaluation but she has not heard back from GI yet.   Jacqueline Willis returns today for routine followup on her cholesterol.  She has been told by multiple doctors that she has fatty liver disease.  She has Dercums disease and we have given her names of specialists to research and let us know if she would like a referral She has ongoing pain, weight issues, painful fatty  deposits in her thighs and lower legs, fatigue and weakness which are s/s of Dercums. We have been managing the symptoms.  Overall, she states she is doing well and is without any complaints or problems at this time. She specifically denies chest pain, abdominal pain, nausea, diarrhea, and myalgias. She remains on dietary management alone for intervention and claims to be adhering well to it thus far. She is fasting in anticipation for labs today.   The patient also has a history of low back that started about 4 years ago.  The onset was not associated with any specific event. The pain tends to be  maximal at no specific time, but waxes and wanes in severity throughout the day depending on what she does. The patient states the pain is aggravated by lifting, prolonged sitting, prolonged standing, and sleeping. No alleviating factors are reported. She denies weakness, changes in sensation in the legs, and bladder/bowel dysfunction. She has been previously treated with NSAIDs and pain medication.She had an MRI of her spine on 08/23/2021 which indicated L4-L5 moderate to severe facet arthropathy, mild neural foraminal narrowing and narrowing in the lateral recess which could be affecting the descending L5 nerve roots, and L5-S1 mild left neural foraminal narrowing.  She states that since losing weight, her back pain is improved and not really effecting her today.  She was sent to see hematology in 08/2021 where her lumbar spine MRI  suggested there may be hematologic reasons behind the changes within this region.  TThe results of the study revealed a decreased marrow signal.  Although nonspecific, it was thought that such a finding could potentially be seen when anemia or some type of infiltrative marrow process is present.  The patient does have a history of blood clots, but denies having other hematologic issues which have ever alerted her to an underlying blood disorder being present.   They felt her marrow changes on MRI are were too nonspecific to formulate any type of diagnosis that an underlying hematologic disorder is present.  There was  nothing per her CBC which suggested a bone marrow biopsy needs to be done for further evaluation.  She states the lower back pain was also associated with radiation down her left leg and also had right hip pain that could be severe.  She had a MRI of the hip which showed mild to moderate bilateral femoroacetabular cartilage thinning and moderate right gluteus medius insertional tendinosis.   The patient also has osteoarthritis of her right knee that she states she began  having pain and swelling about 7-8 years ago.  This can be a  severe aching, throbbing, and sharp right knee pain.  Her left knee is not involved.  She has been seen by Domingo Mend who has done steriod injections in the past.  Theyare arranging for her to have gel shots in this knee.  They have discussed knee replacement surgery with her.     My NP and Dr. Nelson Chimes were also following her this past year for weight loss management.  She has been on adipex for about 6 months per the patient.  They tried her on wegovy last year but this effected her vision.  She remains on adipex and has lost another 6 lbs over the last 2 months.  She denies any side effects from her adipex.   She is not exercising but states she is going to start.  The patient states she is eating healthy.  She denies any fast food or  soft drinks.  The patient is a 57 yo with a history of chronic venous insufficiency.  She used to see the vascular/vein clinic where she has had bilateral lower extremity pain and varicosities and swelling.  She has chronic venous insufficiency with CEAP C1 venous disease (telangiectasias/reticular veins).  She does have significant symptoms of venous hypertension.  She is supposed to wear compression hose and they have discussed the importance of intermittent leg elevation.   They have told her that  venous disease tends to be progressive and that if her symptoms worsen in the future or she develops large truncal varicosities, they would need to repeat her venous reflux study to see if things have progressed.    The patient also has a history of DVT and PE.  Her prior history is significant for unprovoked pulmonary emboli in 2004. There was no evidence of DVT at that time. She was hospitalized one week on heparin and transitioned to coumadin. She took coumadin for about a year.  She is no longer on chronic anticoagulation. Hematology work up was negative for coagulopathy.  She also has a history of LLE DVT while  pregnant in 1997. 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  They recommended hearing protection and regular hearing tests every 1-2 years.  She has a history of obstructive sleep apnea with a home sleep study test in 2022.  She has a CPAP but she stopped using it as she was not getting supplies for this.         Past Medical History Past Medical History:  Diagnosis Date   Arthritis    Chronic venous insufficiency    Fatty liver disease, nonalcoholic    History of DVT (deep vein thrombosis)    History of pulmonary embolus (PE)    History of simple renal cyst    Hypercholesterolemia    OSA (obstructive sleep apnea)    Vitamin D deficiency      Allergies Allergies  Allergen Reactions   Sulfa Antibiotics Anaphylaxis   Vancomycin Itching     Medications  Current Outpatient Medications:    aspirin 81 MG chewable tablet, Chew by mouth., Disp: , Rfl:    pantoprazole (PROTONIX) 40 MG tablet, Take 40 mg by mouth daily., Disp: , Rfl:    phentermine (ADIPEX-P) 37.5 MG tablet, Take 1 tablet (37.5 mg total) by mouth daily before breakfast., Disp: 30 tablet, Rfl: 1   Review of Systems Review of Systems  Constitutional:  Negative for chills, fever and malaise/fatigue.  HENT:  Positive for hearing loss and tinnitus.   Eyes:  Positive for blurred vision. Negative for double vision.  Respiratory:  Negative for cough, shortness of breath and wheezing.   Cardiovascular:  Negative for chest pain, palpitations, orthopnea and leg swelling.  Gastrointestinal:  Negative for abdominal pain, blood in stool, constipation, diarrhea, heartburn, melena, nausea and vomiting.  Genitourinary:  Negative for frequency and hematuria.  Musculoskeletal:  Negative for back pain and myalgias.  Skin:  Positive for  rash. Negative for itching.       Sometimes she gets a rash on her left neck.  Neurological:  Negative for dizziness, weakness and headaches.  Psychiatric/Behavioral:  Negative for depression. The patient is not nervous/anxious.        Objective:  Vitals BP 138/70   Pulse 84   Temp 98.1 F (36.7 C)   Resp 16   Ht 5\' 4"  (1.626 m)   Wt 149 lb 9.6 oz (67.9 kg)   SpO2 99%   BMI 25.68 kg/m    Physical Examination Physical Exam Constitutional:      Appearance: Normal appearance. She is not ill-appearing.  HENT:     Head: Normocephalic and atraumatic.     Right Ear: Tympanic membrane, ear canal and external ear normal.     Left Ear: Tympanic membrane, ear canal and external ear normal.     Nose: Nose normal. No rhinorrhea.     Mouth/Throat:     Mouth: Mucous membranes are moist.     Pharynx: Oropharynx is clear. No oropharyngeal exudate or posterior oropharyngeal erythema.  Eyes:     General: No scleral icterus.    Conjunctiva/sclera: Conjunctivae normal.     Pupils: Pupils are equal, round, and reactive to light.  Neck:     Vascular: No carotid bruit.  Cardiovascular:     Rate and Rhythm: Normal rate and regular rhythm.     Pulses: Normal pulses.     Heart sounds: No murmur heard.    No friction rub. No gallop.  Pulmonary:     Effort: Pulmonary effort is normal. No respiratory distress.     Breath sounds: No wheezing, rhonchi or rales.  Abdominal:     General: Bowel sounds are normal. There is no distension.     Palpations: Abdomen is soft.     Tenderness: There is no abdominal tenderness.  Musculoskeletal:     Cervical back: Neck supple. No tenderness.     Right lower leg: No edema.     Left lower leg: No edema.  Lymphadenopathy:     Cervical: No cervical adenopathy.  Skin:    General: Skin is warm and dry.     Findings: No rash.  Neurological:     General: No focal deficit present.     Mental Status: She is alert and oriented to person, place, and time.   Psychiatric:        Mood and Affect: Mood normal.        Behavior: Behavior normal.        Assessment & Plan:   Chronic venous insufficiency She is to try to wear compression hose and elevated her legs.  This is not really a problem today.  OSA (obstructive sleep apnea) She does not wear her CPAP.  Hopefully with her weight loss, her OSA has improved.  She denies any snoring.  Fatty liver disease, nonalcoholic We will check her liver enzymes at this time. I want her to continue to with lifestyle modfication and control of her weight.  Gastroesophageal reflux disease She has been referred to GI for evaluation.  She takes her protonix as needed.  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  Primary osteoarthritis of right knee She will follow up with ortho for care with her right knee.  Annual physical exam Health maintenance discussed.  We will obtain a mammogram later this year.  We will obtain yearly labs.  BMI 25.0-25.9,adult We will give her another 2 months of adipex.  I asked her to do lifestyle modfication and start exercising.  I want her to continue to eat healthy.  Our goal will be for her to lose 2-3 lbs per month while on adipex.  Hypercholesterolemia We will check her FLP today.  History of DVT (  deep vein thrombosis) She has had a hematology workup and per the notes, she does not have a hypercoagulable state.  History of pulmonary embolus (PE) She has had a hematology workup and per the notes, she does not have a hypercoagulable state.  Lumbar back pain She is not having problems with her back at this time.  She can use tylenol as needed.    Return in about 2 months (around 10/24/2022).   Crist Fat, MD

## 2022-08-24 NOTE — Assessment & Plan Note (Signed)
We will check her liver enzymes at this time. I want her to continue to with lifestyle modfication and control of her weight.

## 2022-08-24 NOTE — Assessment & Plan Note (Signed)
She does not wear her CPAP.  Hopefully with her weight loss, her OSA has improved.  She denies any snoring.

## 2022-08-25 LAB — CMP14 + ANION GAP
ALT: 15 IU/L (ref 0–32)
AST: 17 IU/L (ref 0–40)
Albumin/Globulin Ratio: 1.6 (ref 1.2–2.2)
Albumin: 4.3 g/dL (ref 3.8–4.9)
Alkaline Phosphatase: 78 IU/L (ref 44–121)
Anion Gap: 16 mmol/L (ref 10.0–18.0)
BUN/Creatinine Ratio: 24 — ABNORMAL HIGH (ref 9–23)
BUN: 17 mg/dL (ref 6–24)
Bilirubin Total: 0.6 mg/dL (ref 0.0–1.2)
CO2: 23 mmol/L (ref 20–29)
Calcium: 9.4 mg/dL (ref 8.7–10.2)
Chloride: 104 mmol/L (ref 96–106)
Creatinine, Ser: 0.72 mg/dL (ref 0.57–1.00)
Globulin, Total: 2.7 g/dL (ref 1.5–4.5)
Glucose: 93 mg/dL (ref 70–99)
Potassium: 4.1 mmol/L (ref 3.5–5.2)
Sodium: 143 mmol/L (ref 134–144)
Total Protein: 7 g/dL (ref 6.0–8.5)
eGFR: 97 mL/min/{1.73_m2} (ref >59)

## 2022-08-25 LAB — CBC WITH DIFFERENTIAL/PLATELET
Basophils Absolute: 0.1 10*3/uL (ref 0.0–0.2)
Basos: 1 %
EOS (ABSOLUTE): 0.1 10*3/uL (ref 0.0–0.4)
Eos: 3 %
Hematocrit: 41.4 % (ref 34.0–46.6)
Hemoglobin: 13.7 g/dL (ref 11.1–15.9)
Immature Grans (Abs): 0 10*3/uL (ref 0.0–0.1)
Immature Granulocytes: 0 %
Lymphocytes Absolute: 1.3 10*3/uL (ref 0.7–3.1)
Lymphs: 37 %
MCH: 31.6 pg (ref 26.6–33.0)
MCHC: 33.1 g/dL (ref 31.5–35.7)
MCV: 95 fL (ref 79–97)
Monocytes Absolute: 0.3 10*3/uL (ref 0.1–0.9)
Monocytes: 9 %
Neutrophils Absolute: 1.8 10*3/uL (ref 1.4–7.0)
Neutrophils: 50 %
Platelets: 177 10*3/uL (ref 150–450)
RBC: 4.34 x10E6/uL (ref 3.77–5.28)
RDW: 11.9 % (ref 11.7–15.4)
WBC: 3.6 10*3/uL (ref 3.4–10.8)

## 2022-08-25 LAB — TSH: TSH: 2.28 u[IU]/mL (ref 0.450–4.500)

## 2022-08-25 LAB — LIPID PANEL
Chol/HDL Ratio: 4.3 ratio (ref 0.0–4.4)
Cholesterol, Total: 237 mg/dL — ABNORMAL HIGH (ref 100–199)
HDL: 55 mg/dL (ref >39)
LDL Chol Calc (NIH): 167 mg/dL — ABNORMAL HIGH (ref 0–99)
Triglycerides: 85 mg/dL (ref 0–149)
VLDL Cholesterol Cal: 15 mg/dL (ref 5–40)

## 2022-08-25 LAB — HEMOGLOBIN A1C
Est. average glucose Bld gHb Est-mCnc: 108 mg/dL
Hgb A1c MFr Bld: 5.4 % (ref 4.8–5.6)

## 2022-08-27 ENCOUNTER — Telehealth: Payer: Self-pay

## 2022-08-27 NOTE — Telephone Encounter (Signed)
-----   Message from Crist Fat, MD sent at 08/27/2022 10:38 AM EDT ----- Her labs look good but her cholesterol is elevated.  Her ASCVD score is 3.3%.  I want her to eat healthy and exercise.

## 2022-08-27 NOTE — Telephone Encounter (Signed)
Pt notified of lab results

## 2022-09-02 DIAGNOSIS — R1013 Epigastric pain: Secondary | ICD-10-CM

## 2022-09-14 ENCOUNTER — Ambulatory Visit: Payer: BC Managed Care – PPO | Admitting: Internal Medicine

## 2022-10-19 ENCOUNTER — Encounter: Payer: Self-pay | Admitting: Internal Medicine

## 2022-10-19 ENCOUNTER — Ambulatory Visit: Payer: BC Managed Care – PPO | Admitting: Internal Medicine

## 2022-10-19 VITALS — BP 130/80 | HR 99 | Temp 98.7°F | Resp 16 | Ht 64.0 in | Wt 152.0 lb

## 2022-10-19 DIAGNOSIS — Z6826 Body mass index (BMI) 26.0-26.9, adult: Secondary | ICD-10-CM | POA: Diagnosis not present

## 2022-10-19 DIAGNOSIS — J0100 Acute maxillary sinusitis, unspecified: Secondary | ICD-10-CM

## 2022-10-19 DIAGNOSIS — K760 Fatty (change of) liver, not elsewhere classified: Secondary | ICD-10-CM | POA: Diagnosis not present

## 2022-10-19 DIAGNOSIS — E7800 Pure hypercholesterolemia, unspecified: Secondary | ICD-10-CM | POA: Diagnosis not present

## 2022-10-19 DIAGNOSIS — Z0000 Encounter for general adult medical examination without abnormal findings: Secondary | ICD-10-CM

## 2022-10-19 DIAGNOSIS — E663 Overweight: Secondary | ICD-10-CM | POA: Insufficient documentation

## 2022-10-19 MED ORDER — FLUTICASONE PROPIONATE 50 MCG/ACT NA SUSP: 1.0000 | Freq: Every day | NASAL | 2 refills | Status: DC

## 2022-10-19 MED ORDER — PHENTERMINE HCL 37.5 MG PO TABS: 37.5000 mg | ORAL_TABLET | Freq: Every day | ORAL | 1 refills | Status: DC

## 2022-10-19 MED ORDER — AMOXICILLIN-POT CLAVULANATE 875-125 MG PO TABS: 1.0000 | ORAL_TABLET | Freq: Two times a day (BID) | ORAL | 0 refills | Status: DC

## 2022-10-19 NOTE — Assessment & Plan Note (Signed)
She is overweight with a history of hyperlipidemia.  I will refill her adipex for 2 months.  I want her to cut back on the sugary snacks and eat healthy and continue to exercise.  Our goal will be to lose 2-3 lbs per month while on adipex.

## 2022-10-19 NOTE — Progress Notes (Signed)
Office Visit  Subjective   Patient ID: Jacqueline Willis   DOB: 1965-10-31   Age: 57 y.o.   MRN: [id number]   Chief Complaint Chief Complaint  Patient presents with   Follow-up     History of Present Illness The patient returns today for weigh loss management.  She has gained 3 lbs over the last 3 months.  My NP and Dr. Nelson Chimes were also following her this past year for weight loss management.  She has been on adipex for about 9 months per the patient.  They tried her on wegovy last year but this effected her vision.  She remains on adipex 37.5mg  daily and denies any side effects from her adipex.   She is exercising with doing swimming with her pool at home.  The patient states she is eating more sugary snacks  but she is trying to eat healthy.  She denies any fast food or soft drinks.  The patient is also having some neck pain that started a week ago in her right neck.  She is having sinus and chest congestion with post nasal drip with cough productive of green sputum.  She denies any fevers, chills, SOB, wheezing, sore throat, myalgias, nausea., vomiting or diarrhea.  The patient has been taking allergy medicine which has not helped.  She does not have a history of seasonal allergies or asthma.       Past Medical History Past Medical History:  Diagnosis Date   Arthritis    Chronic venous insufficiency    Fatty liver disease, nonalcoholic    History of DVT (deep vein thrombosis)    History of pulmonary embolus (PE)    History of simple renal cyst    Hypercholesterolemia    OSA (obstructive sleep apnea)    Vitamin D deficiency      Allergies Allergies  Allergen Reactions   Sulfa Antibiotics Anaphylaxis   Vancomycin Itching     Medications  Current Outpatient Medications:    aspirin 81 MG chewable tablet, Chew by mouth., Disp: , Rfl:    pantoprazole (PROTONIX) 40 MG tablet, Take 40 mg by mouth daily., Disp: , Rfl:    phentermine (ADIPEX-P) 37.5 MG tablet, Take 1 tablet (37.5  mg total) by mouth daily before breakfast., Disp: 30 tablet, Rfl: 1   Review of Systems Review of Systems  Constitutional:  Negative for chills and fever.  Eyes:  Negative for blurred vision and double vision.  Respiratory:  Positive for cough and sputum production. Negative for shortness of breath and wheezing.   Cardiovascular:  Negative for chest pain, palpitations and leg swelling.  Gastrointestinal:  Negative for abdominal pain, constipation, diarrhea, nausea and vomiting.  Musculoskeletal:  Negative for myalgias.  Neurological:  Negative for dizziness, weakness and headaches.       Objective:    Vitals BP 130/80   Pulse 99   Temp 98.7 F (37.1 C)   Resp 16   Ht 5\' 4"  (1.626 m)   Wt 152 lb (68.9 kg)   SpO2 97%   BMI 26.09 kg/m    Physical Examination Physical Exam Constitutional:      Appearance: Normal appearance. She is not ill-appearing.  Cardiovascular:     Rate and Rhythm: Normal rate and regular rhythm.     Pulses: Normal pulses.     Heart sounds: No murmur heard.    No friction rub. No gallop.  Pulmonary:     Effort: Pulmonary effort is normal. No respiratory distress.  Breath sounds: No wheezing, rhonchi or rales.  Abdominal:     General: Bowel sounds are normal. There is no distension.     Palpations: Abdomen is soft.     Tenderness: There is no abdominal tenderness.  Musculoskeletal:     Right lower leg: No edema.     Left lower leg: No edema.  Skin:    General: Skin is warm and dry.     Findings: No rash.  Neurological:     Mental Status: She is alert.        Assessment & Plan:   Acute non-recurrent maxillary sinusitis She has sinusitis where I also noted a left tonsillith but she has no tonsillar hypertrophy, erythema or exudate and she has no cervical lymphadenopathy.  I think she has strained her right sternocleidomastoid muscle.  We will start her on augmentin and flonase.  Continue supportive care.  BMI 26.0-26.9,adult She is  overweight with a history of hyperlipidemia.  I will refill her adipex for 2 months.  I want her to cut back on the sugary snacks and eat healthy and continue to exercise.  Our goal will be to lose 2-3 lbs per month while on adipex.    Return in about 2 months (around 12/19/2022).   Crist Fat, MD

## 2022-10-19 NOTE — Assessment & Plan Note (Signed)
She has sinusitis where I also noted a left tonsillith but she has no tonsillar hypertrophy, erythema or exudate and she has no cervical lymphadenopathy.  I think she has strained her right sternocleidomastoid muscle.  We will start her on augmentin and flonase.  Continue supportive care.

## 2022-10-26 DIAGNOSIS — Z1231 Encounter for screening mammogram for malignant neoplasm of breast: Secondary | ICD-10-CM | POA: Diagnosis not present

## 2022-12-14 ENCOUNTER — Ambulatory Visit: Payer: BC Managed Care – PPO | Admitting: Internal Medicine

## 2022-12-14 DIAGNOSIS — M1711 Unilateral primary osteoarthritis, right knee: Secondary | ICD-10-CM | POA: Diagnosis not present

## 2022-12-21 DIAGNOSIS — M1711 Unilateral primary osteoarthritis, right knee: Secondary | ICD-10-CM | POA: Diagnosis not present

## 2022-12-29 DIAGNOSIS — M1711 Unilateral primary osteoarthritis, right knee: Secondary | ICD-10-CM | POA: Diagnosis not present

## 2023-01-12 ENCOUNTER — Ambulatory Visit: Payer: BC Managed Care – PPO | Admitting: Internal Medicine

## 2023-01-25 ENCOUNTER — Encounter: Payer: Self-pay | Admitting: Internal Medicine

## 2023-01-25 ENCOUNTER — Ambulatory Visit: Payer: BC Managed Care – PPO | Admitting: Internal Medicine

## 2023-01-25 VITALS — BP 116/74 | HR 81 | Temp 98.0°F | Resp 18 | Ht 64.0 in | Wt 154.2 lb

## 2023-01-25 DIAGNOSIS — R5383 Other fatigue: Secondary | ICD-10-CM | POA: Insufficient documentation

## 2023-01-25 DIAGNOSIS — E860 Dehydration: Secondary | ICD-10-CM | POA: Insufficient documentation

## 2023-01-25 DIAGNOSIS — R232 Flushing: Secondary | ICD-10-CM

## 2023-01-25 LAB — POCT URINALYSIS DIPSTICK
Bilirubin, UA: NEGATIVE
Blood, UA: NEGATIVE
Glucose, UA: NEGATIVE
Ketones, UA: NEGATIVE
Nitrite, UA: POSITIVE
Protein, UA: NEGATIVE
Spec Grav, UA: >=1.03 — AB (ref 1.010–1.025)
Urobilinogen, UA: 0.2 U/dL
pH, UA: 5.5 (ref 5.0–8.0)

## 2023-01-25 NOTE — Assessment & Plan Note (Signed)
She is mildly dehydrated on exam.  I want her to drink more fluids.  We will check labs today.

## 2023-01-25 NOTE — Assessment & Plan Note (Signed)
I will check her females hormones at this time.

## 2023-01-25 NOTE — Progress Notes (Unsigned)
Office Visit  Subjective   Patient ID: Jacqueline Willis   DOB: 1966/04/12   Age: 57 y.o.   MRN: [id number]   Chief Complaint Chief Complaint  Patient presents with   Acute Visit     History of Present Illness Mrs. Rother comes in today with complaints of feeling some lymph nodes on her neck.  She first noticed them last week but there is no pain and the lymph nodes are barely palpable.  She also complains of some dark colored urine and is strong smelling.  There is no dysuria, hematuria, no urinary frequency, abdominal pain, fevers, chills, nausea or vomiting.  The patient also is complaining of some fatigue that began about 3 weeks ago.     Past Medical History Past Medical History:  Diagnosis Date   Arthritis    Chronic venous insufficiency    Fatty liver disease, nonalcoholic    History of DVT (deep vein thrombosis)    History of pulmonary embolus (PE)    History of simple renal cyst    Hypercholesterolemia    OSA (obstructive sleep apnea)    Vitamin D deficiency      Allergies Allergies  Allergen Reactions   Sulfa Antibiotics Anaphylaxis   Vancomycin Itching     Medications  Current Outpatient Medications:    aspirin 81 MG chewable tablet, Chew by mouth., Disp: , Rfl:    fluticasone (FLONASE) 50 MCG/ACT nasal spray, Place 1 spray into both nostrils daily., Disp: 15.8 mL, Rfl: 2   pantoprazole (PROTONIX) 40 MG tablet, Take 40 mg by mouth daily., Disp: , Rfl:    Review of Systems Review of Systems  Constitutional:  Negative for fever.  Eyes:  Negative for blurred vision and double vision.  Respiratory:  Positive for cough. Negative for shortness of breath.   Cardiovascular:  Negative for chest pain, palpitations and leg swelling.  Gastrointestinal:  Negative for abdominal pain, blood in stool, constipation, diarrhea, melena, nausea and vomiting.  Genitourinary:  Negative for dysuria, frequency, hematuria and urgency.  Musculoskeletal:  Positive for myalgias.   Skin:  Negative for itching and rash.  Neurological:  Positive for dizziness. Negative for weakness and headaches.       Objective:    Vitals BP 116/74   Pulse 81   Temp 98 F (36.7 C)   Resp 18   Ht 5\' 4"  (1.626 m)   Wt 154 lb 3.2 oz (69.9 kg)   SpO2 99%   BMI 26.47 kg/m    Physical Examination Physical Exam Constitutional:      Appearance: Normal appearance. She is not ill-appearing.  Cardiovascular:     Rate and Rhythm: Normal rate and regular rhythm.     Pulses: Normal pulses.     Heart sounds: No murmur heard.    No friction rub. No gallop.  Pulmonary:     Effort: Pulmonary effort is normal. No respiratory distress.     Breath sounds: No wheezing, rhonchi or rales.  Abdominal:     General: Bowel sounds are normal. There is no distension.     Palpations: Abdomen is soft.     Tenderness: There is no abdominal tenderness.  Musculoskeletal:     Right lower leg: No edema.     Left lower leg: No edema.  Skin:    General: Skin is warm and dry.     Findings: No rash.     Comments: Her skin turgor is abormal with delay.  Neurological:     Mental  Status: She is alert.        Assessment & Plan:   Hot flashes I will check her females hormones at this time.  Fatigue We will check some labs today.  Dehydration She is mildly dehydrated on exam.  I want her to drink more fluids.  We will check labs today.    No follow-ups on file.   Crist Fat, MD

## 2023-01-25 NOTE — Assessment & Plan Note (Signed)
We will check some labs today.

## 2023-01-26 LAB — CBC WITH DIFFERENTIAL/PLATELET
Basophils Absolute: 0.1 10*3/uL (ref 0.0–0.2)
Basos: 2 %
EOS (ABSOLUTE): 0.1 10*3/uL (ref 0.0–0.4)
Eos: 3 %
Hematocrit: 42.1 % (ref 34.0–46.6)
Hemoglobin: 13.9 g/dL (ref 11.1–15.9)
Immature Grans (Abs): 0 10*3/uL (ref 0.0–0.1)
Immature Granulocytes: 0 %
Lymphocytes Absolute: 1.6 10*3/uL (ref 0.7–3.1)
Lymphs: 41 %
MCH: 31.7 pg (ref 26.6–33.0)
MCHC: 33 g/dL (ref 31.5–35.7)
MCV: 96 fL (ref 79–97)
Monocytes Absolute: 0.4 10*3/uL (ref 0.1–0.9)
Monocytes: 9 %
Neutrophils Absolute: 1.8 10*3/uL (ref 1.4–7.0)
Neutrophils: 45 %
Platelets: 196 10*3/uL (ref 150–450)
RBC: 4.38 x10E6/uL (ref 3.77–5.28)
RDW: 11.4 % — ABNORMAL LOW (ref 11.7–15.4)
WBC: 3.9 10*3/uL (ref 3.4–10.8)

## 2023-01-26 LAB — CMP14 + ANION GAP
ALT: 15 [IU]/L (ref 0–32)
AST: 23 [IU]/L (ref 0–40)
Albumin: 4.4 g/dL (ref 3.8–4.9)
Alkaline Phosphatase: 71 [IU]/L (ref 44–121)
Anion Gap: 14 mmol/L (ref 10.0–18.0)
BUN/Creatinine Ratio: 24 — ABNORMAL HIGH (ref 9–23)
BUN: 16 mg/dL (ref 6–24)
Bilirubin Total: 0.5 mg/dL (ref 0.0–1.2)
CO2: 27 mmol/L (ref 20–29)
Calcium: 9.4 mg/dL (ref 8.7–10.2)
Chloride: 103 mmol/L (ref 96–106)
Creatinine, Ser: 0.67 mg/dL (ref 0.57–1.00)
Globulin, Total: 2.8 g/dL (ref 1.5–4.5)
Glucose: 83 mg/dL (ref 70–99)
Potassium: 3.9 mmol/L (ref 3.5–5.2)
Sodium: 144 mmol/L (ref 134–144)
Total Protein: 7.2 g/dL (ref 6.0–8.5)
eGFR: 101 mL/min/{1.73_m2} (ref >59)

## 2023-01-26 LAB — ESTRADIOL: Estradiol: 8.4 pg/mL

## 2023-01-26 LAB — FSH/LH
FSH: 41.8 m[IU]/mL
LH: 16.8 m[IU]/mL

## 2023-01-26 LAB — TSH: TSH: 2.05 u[IU]/mL (ref 0.450–4.500)

## 2023-01-28 ENCOUNTER — Other Ambulatory Visit: Payer: Self-pay | Admitting: Internal Medicine

## 2023-01-28 MED ORDER — AMOXICILLIN-POT CLAVULANATE 875-125 MG PO TABS: 1.0000 | ORAL_TABLET | Freq: Two times a day (BID) | ORAL | 0 refills | Status: DC

## 2023-01-28 NOTE — Progress Notes (Signed)
Her labs are normal but her labs show she is postmenopausal.  Patient notified of labs

## 2023-01-29 ENCOUNTER — Other Ambulatory Visit: Payer: Self-pay

## 2023-01-29 DIAGNOSIS — J0100 Acute maxillary sinusitis, unspecified: Secondary | ICD-10-CM

## 2023-01-29 MED ORDER — AMOXICILLIN-POT CLAVULANATE 875-125 MG PO TABS: 1.0000 | ORAL_TABLET | Freq: Two times a day (BID) | ORAL | 0 refills | Status: AC

## 2023-01-29 NOTE — Progress Notes (Signed)
New Rx

## 2023-02-01 ENCOUNTER — Ambulatory Visit: Payer: BC Managed Care – PPO | Admitting: Internal Medicine

## 2023-02-01 DIAGNOSIS — M1711 Unilateral primary osteoarthritis, right knee: Secondary | ICD-10-CM | POA: Diagnosis not present

## 2023-02-02 ENCOUNTER — Encounter: Payer: Self-pay | Admitting: Internal Medicine

## 2023-02-08 ENCOUNTER — Ambulatory Visit: Payer: BC Managed Care – PPO | Admitting: Internal Medicine

## 2023-02-08 ENCOUNTER — Encounter: Payer: Self-pay | Admitting: Internal Medicine

## 2023-02-08 VITALS — BP 124/76 | HR 85 | Temp 98.3°F | Resp 18 | Ht 64.0 in | Wt 154.6 lb

## 2023-02-08 DIAGNOSIS — M1711 Unilateral primary osteoarthritis, right knee: Secondary | ICD-10-CM | POA: Diagnosis not present

## 2023-02-08 DIAGNOSIS — Z01810 Encounter for preprocedural cardiovascular examination: Secondary | ICD-10-CM | POA: Diagnosis not present

## 2023-02-08 NOTE — Assessment & Plan Note (Signed)
We did an EKG on her and this showed NSR.  I reviewed her history.  She has had recent ECHO and stress testing that were normal.  She is low risk and can proceed with her surgery.  She can stop her ASA 5-7 days before her procedure.  She can stop off adipex as it is not working and she is not losing weight.

## 2023-02-08 NOTE — Progress Notes (Signed)
Office Visit  Subjective   Patient ID: Jacqueline Willis   DOB: Dec 19, 1965   Age: 57 y.o.   MRN: [id number]   Chief Complaint Chief Complaint  Patient presents with   Follow-up     History of Present Illness The patient is a 57 yo female who comes in today for a preoperative clearance for upcoming right TKA.  The patient has osteoarthritis of her right knee that she states she began having pain and swelling about 7-8 years ago.  This can be a  severe aching, throbbing, and sharp right knee pain.   She has been seen by Domingo Mend who has done steriod injections in the past.  They gel injections of this knee in 10/2022 and this did not help.    They have discussed knee replacement surgery with her.    In regards to her risk factors, she has hyperlipidemia that she controls with diet.  We did lab testing in 07/2022 and her ASCVD score was 3.3%.  She was hospitalized in 07/2022 with abdominal and chest pain. Her troponins were negative and the hospitalists felt her symptoms were due to a GI cause.  She underwent a CTA of the chest, abd/pelvis and this was unremarkable except for some irregular plaques in her SMA and celiac arteries without any high grade stenosis.  She underwent an ECHO which showed a normal LVEF of 60-65% and her LV diastolic function was undetermined. She also underwent stress testing which showed no reversible ischemia and a preserved EF.  They suspected her symptoms were GI related.     The patient also has a history of DVT and PE.  Her prior history is significant for unprovoked pulmonary emboli in 2004. There was no evidence of DVT at that time. She was hospitalized one week on heparin and transitioned to coumadin. She took coumadin for about a year.  She is no longer on chronic anticoagulation. Hematology work up was negative for coagulopathy.  She also has a history of LLE DVT while pregnant in 1997. 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      Past  Medical History Past Medical History:  Diagnosis Date   Arthritis    Chronic venous insufficiency    Fatty liver disease, nonalcoholic    History of DVT (deep vein thrombosis)    History of pulmonary embolus (PE)    History of simple renal cyst    Hypercholesterolemia    OSA (obstructive sleep apnea)    Vitamin D deficiency      Allergies Allergies  Allergen Reactions   Sulfa Antibiotics Anaphylaxis   Vancomycin Itching     Medications  Current Outpatient Medications:    aspirin 81 MG chewable tablet, Chew by mouth., Disp: , Rfl:    fluticasone (FLONASE) 50 MCG/ACT nasal spray, Place 1 spray into both nostrils daily., Disp: 15.8 mL, Rfl: 2   pantoprazole (PROTONIX) 40 MG tablet, Take 40 mg by mouth daily., Disp: , Rfl:    Review of Systems Review of Systems  Constitutional:  Negative for chills and fever.  Respiratory:  Negative for shortness of breath.   Cardiovascular:  Negative for chest pain, palpitations and leg swelling.  Gastrointestinal:  Negative for abdominal pain, constipation, diarrhea, nausea and vomiting.  Neurological:  Negative for dizziness, weakness and headaches.       Objective:    Vitals BP 124/76   Pulse 85   Temp 98.3 F (36.8 C)   Resp 18  Ht 5\' 4"  (1.626 m)   Wt 154 lb 9.6 oz (70.1 kg)   SpO2 97%   BMI 26.54 kg/m    Physical Examination Physical Exam Constitutional:      Appearance: Normal appearance. She is not ill-appearing.  Cardiovascular:     Rate and Rhythm: Normal rate and regular rhythm.     Pulses: Normal pulses.     Heart sounds: No murmur heard.    No friction rub. No gallop.  Pulmonary:     Effort: Pulmonary effort is normal. No respiratory distress.     Breath sounds: No wheezing, rhonchi or rales.  Abdominal:     General: Bowel sounds are normal. There is no distension.     Palpations: Abdomen is soft.     Tenderness: There is no abdominal tenderness.  Musculoskeletal:     Right lower leg: No edema.     Left  lower leg: No edema.  Skin:    General: Skin is warm and dry.     Findings: No rash.  Neurological:     Mental Status: She is alert.        Assessment & Plan:   Primary osteoarthritis of right knee We did an EKG on her and this showed NSR.  I reviewed her history.  She has had recent ECHO and stress testing that were normal.  She is low risk and can proceed with her surgery.  She can stop her ASA 5-7 days before her procedure.  She can stop off adipex as it is not working and she is not losing weight.    Return in about 3 months (around 05/11/2023).   Crist Fat, MD

## 2023-02-09 ENCOUNTER — Telehealth (HOSPITAL_COMMUNITY): Payer: Self-pay | Admitting: *Deleted

## 2023-02-09 NOTE — Telephone Encounter (Signed)
Received fax request from [facility] requesting temporary IVC filter placement by Dr Randie Heinz prior to TKA.  Will give to [facility]

## 2023-02-11 ENCOUNTER — Encounter: Payer: Self-pay | Admitting: Internal Medicine

## 2023-02-16 ENCOUNTER — Other Ambulatory Visit: Payer: Self-pay

## 2023-02-16 DIAGNOSIS — Z86718 Personal history of other venous thrombosis and embolism: Secondary | ICD-10-CM

## 2023-03-01 ENCOUNTER — Encounter: Payer: Self-pay | Admitting: Student

## 2023-03-01 ENCOUNTER — Ambulatory Visit: Payer: BC Managed Care – PPO | Admitting: Student

## 2023-03-01 ENCOUNTER — Other Ambulatory Visit: Payer: Self-pay | Admitting: Student

## 2023-03-01 VITALS — BP 144/78 | HR 94 | Temp 101.4°F | Resp 18 | Ht 64.0 in | Wt 156.4 lb

## 2023-03-01 DIAGNOSIS — H65191 Other acute nonsuppurative otitis media, right ear: Secondary | ICD-10-CM | POA: Diagnosis not present

## 2023-03-01 DIAGNOSIS — J019 Acute sinusitis, unspecified: Secondary | ICD-10-CM | POA: Diagnosis not present

## 2023-03-01 LAB — POC COVID19 BINAXNOW: SARS Coronavirus 2 Ag: NEGATIVE

## 2023-03-01 LAB — POCT RAPID STREP A (OFFICE): Rapid Strep A Screen: NEGATIVE

## 2023-03-01 MED ORDER — DM-GUAIFENESIN ER 30-600 MG PO TB12: 1.0000 | ORAL_TABLET | Freq: Two times a day (BID) | ORAL | 0 refills | Status: DC

## 2023-03-01 MED ORDER — AMOXICILLIN-POT CLAVULANATE 875-125 MG PO TABS: 1.0000 | ORAL_TABLET | Freq: Two times a day (BID) | ORAL | 0 refills | Status: AC

## 2023-03-01 MED ORDER — METHYLPREDNISOLONE 4 MG PO TBPK: ORAL_TABLET | ORAL | 0 refills | Status: DC

## 2023-03-01 NOTE — Assessment & Plan Note (Addendum)
Covid and step negative. She does have tonsil stones, maxillary sinus pressure and green sputum production. I will send in steroid dose pack. She should continue with supportive care and salt water rinses. She should add a daily antihistamine.

## 2023-03-01 NOTE — Assessment & Plan Note (Signed)
Right ear otitis media, I will treat with amoxicillin for 7 days.

## 2023-03-01 NOTE — Progress Notes (Signed)
Acute Office Visit  Subjective:     Patient ID: Jacqueline Willis, female    DOB: [birth year], 57 y.o.   MRN: [id number]  Chief Complaint  Patient presents with   Sore Throat    Pt. C/o:  Post going to a game. X 3 days, Sore throat, coughing up green mucous, body aches, fever. Rt. Ear ache.    Sore Throat  Associated symptoms include congestion, coughing, ear pain and headaches. Pertinent negatives include no shortness of breath.    Subjective:     Jacqueline Willis is a 57 y.o. female who presents for evaluation of symptoms of a URI, possible sinusitis. Symptoms include fevers up to 101.4 degrees, chills, dry cough, right ear pain and pressure, green nasal discharge, post nasal drip, productive cough, sinus and nasal congestion, and sore throat. Onset of symptoms was 3 days ago, gradually worsening since that time. Associated symptoms include headache described as frontal with eye pain . She has used OTC dayquil and nyquil along with NSAID and tylenol.    Review of Systems  Constitutional:  Positive for chills and fever.  HENT:  Positive for congestion, ear pain, sinus pain and sore throat.   Eyes:  Positive for pain.  Respiratory:  Positive for cough and sputum production. Negative for shortness of breath.   Cardiovascular: Negative.   Gastrointestinal: Negative.   Genitourinary: Negative.   Musculoskeletal:  Positive for myalgias.  Skin: Negative.   Neurological:  Positive for headaches.  Psychiatric/Behavioral: Negative.          Objective:    BP (!) 144/78 (BP Location: Right Arm)   Pulse 94   Temp (!) 101.4 F (38.6 C) (Temporal)   Resp 18   Ht 5\' 4"  (1.626 m)   Wt 156 lb 6 oz (70.9 kg)   SpO2 97%   BMI 26.84 kg/m    Physical Exam Vitals reviewed.  HENT:     Head: Normocephalic and atraumatic.     Right Ear: Ear canal normal. Tenderness present. A middle ear effusion is present.     Left Ear: Tympanic membrane and ear canal normal.     Nose: Rhinorrhea  present.     Mouth/Throat:     Mouth: Mucous membranes are moist.     Pharynx: Oropharyngeal exudate and posterior oropharyngeal erythema present. No uvula swelling.     Tonsils: Tonsillar exudate present. No tonsillar abscesses (white-tonsil tones on right side). 1+ on the right. 1+ on the left.  Eyes:     Conjunctiva/sclera: Conjunctivae normal.  Neck:     Thyroid: No thyromegaly.  Cardiovascular:     Rate and Rhythm: Normal rate and regular rhythm.     Heart sounds: Normal heart sounds.  Pulmonary:     Effort: Pulmonary effort is normal.     Breath sounds: Normal breath sounds.  Abdominal:     General: Bowel sounds are normal.     Palpations: Abdomen is soft.  Musculoskeletal:     Cervical back: Normal range of motion and neck supple.  Lymphadenopathy:     Cervical: Cervical adenopathy present.  Skin:    General: Skin is warm and dry.     Capillary Refill: Capillary refill takes less than 2 seconds.     Findings: Erythema present.  Neurological:     General: No focal deficit present.     Mental Status: She is alert and oriented to person, place, and time.  Psychiatric:        Mood and Affect:  Mood normal.        Behavior: Behavior normal.     Results for orders placed or performed in visit on 03/01/23  POC COVID-19 BinaxNow  Result Value Ref Range   SARS Coronavirus 2 Ag Negative Negative  POCT rapid strep A  Result Value Ref Range   Rapid Strep A Screen Negative Negative        Assessment & Plan:   Problem List Items Addressed This Visit     Acute sinusitis - Primary    Covid and step negative. She does have tonsil stones, maxillary sinus pressure and green sputum production. I will send in steroid dose pack. She should continue with supportive care and salt water rinses. She should add a daily antihistamine.       Relevant Medications   amoxicillin-clavulanate (AUGMENTIN) 875-125 MG tablet   methylPREDNISolone (MEDROL DOSEPAK) 4 MG TBPK tablet   Other  Relevant Orders   POC COVID-19 BinaxNow (Completed)   POCT rapid strep A (Completed)   Acute mucoid otitis media of right ear    Right ear otitis media, I will treat with amoxicillin for 7 days.       Relevant Medications   amoxicillin-clavulanate (AUGMENTIN) 875-125 MG tablet    Meds ordered this encounter  Medications   amoxicillin-clavulanate (AUGMENTIN) 875-125 MG tablet    Sig: Take 1 tablet by mouth 2 (two) times daily for 7 days.    Dispense:  14 tablet    Refill:  0   methylPREDNISolone (MEDROL DOSEPAK) 4 MG TBPK tablet    Sig: Take as directed    Dispense:  21 tablet    Refill:  0    No follow-ups on file.  Edwena Blow, NP

## 2023-03-01 NOTE — Patient Instructions (Signed)
Warm salt water gargles.   Finish the entire course of antibiotics and steroids. Use tylenol and advil for fever and body aches. Increase your fluids.

## 2023-03-03 ENCOUNTER — Telehealth: Payer: Self-pay

## 2023-03-03 ENCOUNTER — Other Ambulatory Visit: Payer: Self-pay | Admitting: Student

## 2023-03-04 ENCOUNTER — Other Ambulatory Visit: Payer: Self-pay | Admitting: Student

## 2023-03-05 NOTE — Telephone Encounter (Signed)
Problem addressed.

## 2023-03-15 ENCOUNTER — Encounter (HOSPITAL_COMMUNITY)
Admission: RE | Disposition: A | Discharge status: Home / Self Care | Payer: Self-pay | Source: Home / Self Care | Attending: Vascular Surgery

## 2023-03-15 ENCOUNTER — Other Ambulatory Visit: Payer: Self-pay

## 2023-03-15 ENCOUNTER — Encounter (HOSPITAL_COMMUNITY): Payer: Self-pay | Admitting: Vascular Surgery

## 2023-03-15 ENCOUNTER — Ambulatory Visit (HOSPITAL_COMMUNITY)
Admission: RE | Admit: 2023-03-15 | Discharge: 2023-03-15 | Disposition: A | Discharge status: Home / Self Care | Payer: BC Managed Care – PPO | Attending: Vascular Surgery | Admitting: Vascular Surgery

## 2023-03-15 DIAGNOSIS — R600 Localized edema: Secondary | ICD-10-CM | POA: Insufficient documentation

## 2023-03-15 DIAGNOSIS — Z408 Encounter for other prophylactic surgery: Secondary | ICD-10-CM | POA: Diagnosis not present

## 2023-03-15 DIAGNOSIS — Z86711 Personal history of pulmonary embolism: Secondary | ICD-10-CM

## 2023-03-15 DIAGNOSIS — Z86718 Personal history of other venous thrombosis and embolism: Secondary | ICD-10-CM | POA: Diagnosis not present

## 2023-03-15 DIAGNOSIS — Z7982 Long term (current) use of aspirin: Secondary | ICD-10-CM | POA: Insufficient documentation

## 2023-03-15 HISTORY — PX: IVC FILTER INSERTION: CATH118245

## 2023-03-15 LAB — POCT I-STAT, CHEM 8
BUN: 19 mg/dL (ref 6–20)
Calcium, Ion: 1.24 mmol/L (ref 1.15–1.40)
Chloride: 102 mmol/L (ref 98–111)
Creatinine, Ser: 0.8 mg/dL (ref 0.44–1.00)
Glucose, Bld: 98 mg/dL (ref 70–99)
HCT: 38 % (ref 36.0–46.0)
Hemoglobin: 12.9 g/dL (ref 12.0–15.0)
Potassium: 3.8 mmol/L (ref 3.5–5.1)
Sodium: 142 mmol/L (ref 135–145)
TCO2: 28 mmol/L (ref 22–32)

## 2023-03-15 SURGERY — IVC FILTER INSERTION
Anesthesia: LOCAL

## 2023-03-15 MED ORDER — IODIXANOL 320 MG/ML IV SOLN
INTRAVENOUS | Status: DC | PRN
  Administered 2023-03-15: 15 mL

## 2023-03-15 MED ORDER — ACETAMINOPHEN 325 MG PO TABS
650.0000 mg | ORAL_TABLET | Freq: Once | ORAL | Status: AC
  Administered 2023-03-15: 650 mg via ORAL

## 2023-03-15 MED ORDER — HEPARIN (PORCINE) IN NACL 1000-0.9 UT/500ML-% IV SOLN
INTRAVENOUS | Status: DC | PRN
  Administered 2023-03-15: 500 mL

## 2023-03-15 MED ORDER — LIDOCAINE HCL (PF) 1 % IJ SOLN
INTRAMUSCULAR | Status: DC | PRN
  Administered 2023-03-15: 15 mL

## 2023-03-15 MED ORDER — MIDAZOLAM HCL 2 MG/2ML IJ SOLN
INTRAMUSCULAR | Status: AC
  Filled 2023-03-15: qty 2

## 2023-03-15 MED ORDER — SODIUM CHLORIDE 0.9% FLUSH: 10.0000 mL | Freq: Two times a day (BID) | INTRAVENOUS | Status: DC

## 2023-03-15 MED ORDER — MIDAZOLAM HCL 2 MG/2ML IJ SOLN
INTRAMUSCULAR | Status: DC | PRN
  Administered 2023-03-15: 1 mg via INTRAVENOUS

## 2023-03-15 MED ORDER — LIDOCAINE HCL (PF) 1 % IJ SOLN
INTRAMUSCULAR | Status: AC
  Filled 2023-03-15: qty 30

## 2023-03-15 MED ORDER — ACETAMINOPHEN 325 MG PO TABS
ORAL_TABLET | ORAL | Status: AC
  Filled 2023-03-15: qty 2

## 2023-03-15 SURGICAL SUPPLY — 8 items
COVER DOME SNAP 22 D (MISCELLANEOUS) IMPLANT
FILTER VC CELECT-FEMORAL (Filter) IMPLANT
KIT MICROPUNCTURE NIT STIFF (SHEATH) IMPLANT
KIT SYRINGE INJ CVI SPIKEX1 (MISCELLANEOUS) IMPLANT
SET ATX-X65L (MISCELLANEOUS) IMPLANT
SHEATH PROBE COVER 6X72 (BAG) IMPLANT
TRAY PV CATH (CUSTOM PROCEDURE TRAY) ×1 IMPLANT
WIRE BENTSON .035X145CM (WIRE) IMPLANT

## 2023-03-15 NOTE — H&P (Signed)
H+P  History of Present Illness: This is a 57 y.o. female history of previous dvt when pregnant and pe several years ago without evidence of dvt. No longer on anticoagulation. She does have edema of the Left leg does not wear compression. Plan for right TKA in December.   Past Medical History:  Diagnosis Date   Arthritis    Chronic venous insufficiency    Fatty liver disease, nonalcoholic    History of DVT (deep vein thrombosis)    History of pulmonary embolus (PE)    History of simple renal cyst    Hypercholesterolemia    OSA (obstructive sleep apnea)    Vitamin D deficiency     Past Surgical History:  Procedure Laterality Date   ABDOMINAL HYSTERECTOMY     PARTIAL   ARTHROSCOPY KNEE W/ DRILLING Right 2009   HAND TENDON SURGERY  1983   INCONTINENCE SURGERY  2010   BLADDER SLING   NOSE SURGERY     WISDOM TOOTH EXTRACTION      Allergies  Allergen Reactions   Sulfa Antibiotics Anaphylaxis   Vancomycin Itching    Prior to Admission medications   Medication Sig Start Date End Date Taking? Authorizing Provider  aspirin 81 MG chewable tablet Chew 81 mg by mouth daily.   Yes [provider]    Social History   Socioeconomic History   Marital status: Married    Spouse name: ADIO   Number of children: 3   Years of education: 12 + 4   Highest education level: Not on file  Occupational History   Not on file  Tobacco Use   Smoking status: Never   Smokeless tobacco: Never  Vaping Use   Vaping status: Never Used  Substance and Sexual Activity   Alcohol use: Never   Drug use: Never   Sexual activity: Yes  Other Topics Concern   Not on file  Social History Narrative   Not on file   Social Determinants of Health   Financial Resource Strain: Not on file  Food Insecurity: Not on file  Transportation Needs: Not on file  Physical Activity: Not on file  Stress: Not on file  Social Connections: Not on file  Intimate Partner Violence: Not on file      Family History  Problem Relation Age of Onset   Diabetes Mother    Pancreatic cancer Mother    Renal Disease Mother    Bladder Cancer Father    COPD Father    Emphysema Father    Lung cancer Sister    Prostate cancer Brother     ROS: right knee pain   Physical Examination  Vitals:   03/15/23 0559  BP: (!) 147/73  Pulse: 82  Resp: 18  Temp: 97.8 F (36.6 C)  SpO2: 100%   Body mass index is 26.09 kg/m.  Aaox3 Non labored respirations Mild non pitting edema LLE  CBC    Component Value Date/Time   WBC 3.9 01/25/2023 0000   RBC 4.38 01/25/2023 0000   RBC 4.62 09/16/2021 0000   HGB 12.9 03/15/2023 0554   HGB 13.9 01/25/2023 0000   HCT 38.0 03/15/2023 0554   HCT 42.1 01/25/2023 0000   PLT 196 01/25/2023 0000   MCV 96 01/25/2023 0000   MCV 92 09/16/2021 0000   MCH 31.7 01/25/2023 0000   MCHC 33.0 01/25/2023 0000   RDW 11.4 (L) 01/25/2023 0000   LYMPHSABS 1.6 01/25/2023 0000   EOSABS 0.1 01/25/2023 0000   BASOSABS 0.1  01/25/2023 0000    BMET    Component Value Date/Time   NA 142 03/15/2023 0554   NA 144 01/25/2023 0000   K 3.8 03/15/2023 0554   CL 102 03/15/2023 0554   CO2 27 01/25/2023 0000   GLUCOSE 98 03/15/2023 0554   BUN 19 03/15/2023 0554   BUN 16 01/25/2023 0000   CREATININE 0.80 03/15/2023 0554   CALCIUM 9.4 01/25/2023 0000    COAGS: No results found for: "INR", "PROTIME"   Non-Invasive Vascular Imaging:   No studies  ASSESSMENT/PLAN: This is a 57 y.o. female is planned for R TKA with previous thrombotic issues on 2 separate occasions. Plan for IVC filter placement today. Risks, benefits, alternatives discussed as well as need to remove when orthopedic procedures complete.  Rafal Archuleta C. Randie Heinz, MD [facility] Office: [phone] Pager: [phone]

## 2023-03-15 NOTE — Op Note (Signed)
    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 Randie Heinz, MD Procedure Performed: 1.  Ultrasound-guided cannulation right common femoral vein 2.  IVC venogram 3.  Placement of infrarenal Cook Celect IVC filter  Indications: 57 year old female history of DVT when she was pregnant and a subsequent PE without any evidence of DVT.  She is no longer on anticoagulation but does take aspirin daily.  She is planned for right total knee arthroplasty and we have discussed placing IVC filter to reduce thrombotic complications.  Findings: Evaluated left renal vein about 5 in the IVC measured less than 20 mm in the IVC filter was placed midline in the IVC just below the left renal vein.   Procedure:  The patient was identified in the holding area and taken to room 8.  The patient was then placed supine on the table and prepped and draped in the usual sterile fashion.  A time out was called.  Ultrasound was used to evaluate the right common femoral vein which was patent and compressible and the area was anesthetized 1% lidocaine and cannulated with micropuncture needle followed by wire and a sheath.  Concomitantly we administered Versed but fentanyl was not administered due to patient reluctance.  We then placed a Bentson wire followed by the dilator and placed the filter introducer sheath and performed IVC venogram.  With the above findings we then placed an IVC filter on the fluoroscopic guidance as below the left renal vein.  Still image was taken.  The introducer sheath was removed and pressure held over the sinuses was obtained.  Patient tolerated the procedure without any complication.   Contrast: 15 cc   Suzzanne Brunkhorst C. Randie Heinz, MD [facility] Office: [phone] Pager: [phone]

## 2023-03-16 ENCOUNTER — Telehealth: Payer: Self-pay

## 2023-03-16 NOTE — Telephone Encounter (Signed)
Pt called with c/o "throbbing hip pain" that began after IVC filter placement yesterday. MD was made aware post operatively. She states this is persisting today-even with having taken Ibuprofen and using heat. MD made aware. He wants pt to have CT of abd/pelvis if pain does not improve. Pt is aware.

## 2023-03-17 ENCOUNTER — Telehealth: Payer: Self-pay

## 2023-03-17 NOTE — Progress Notes (Signed)
Surgery orders requested via Epic inbox. °

## 2023-03-17 NOTE — Telephone Encounter (Signed)
Pt called stating that her pain has improved slightly, but not fully relieved. She requested a possible muscle relaxer and questioned the CT.  Spoke to Dr. Randie Heinz, who advised that she continue her home treatment plan, monitor, and if still having pain next week, she needs to call and this office will order a CT abd/pel.  Called pt, two identifiers used. Relayed Dr. Darcella Cheshire advice of cont'd monitoring. Pt thankful to hold off on getting CT. Pt requested a return to work letter to work from home for the remainder of this week, so she didn't have to drive, d/t the discomfort she is having. Letter sent via MyChart. Confirmed understanding.

## 2023-03-19 ENCOUNTER — Ambulatory Visit: Payer: Self-pay | Admitting: Student

## 2023-03-22 ENCOUNTER — Other Ambulatory Visit (HOSPITAL_COMMUNITY): Payer: Self-pay

## 2023-03-22 DIAGNOSIS — M25561 Pain in right knee: Secondary | ICD-10-CM | POA: Diagnosis not present

## 2023-03-22 DIAGNOSIS — M25661 Stiffness of right knee, not elsewhere classified: Secondary | ICD-10-CM | POA: Diagnosis not present

## 2023-03-22 DIAGNOSIS — M1711 Unilateral primary osteoarthritis, right knee: Secondary | ICD-10-CM | POA: Diagnosis not present

## 2023-03-23 ENCOUNTER — Other Ambulatory Visit (HOSPITAL_COMMUNITY): Payer: Self-pay | Admitting: *Deleted

## 2023-03-23 NOTE — Patient Instructions (Signed)
DUE TO COVID-19 ONLY TWO VISITORS  (aged 58 and older)  ARE ALLOWED TO COME WITH YOU AND STAY IN THE WAITING ROOM ONLY DURING PRE OP AND PROCEDURE.   **NO VISITORS ARE ALLOWED IN THE SHORT STAY AREA OR RECOVERY ROOM!!**  IF YOU WILL BE ADMITTED INTO THE HOSPITAL YOU ARE ALLOWED ONLY FOUR SUPPORT PEOPLE DURING VISITATION HOURS ONLY (7 AM -8PM)   The support person(s) must pass our screening, gel in and out, and wear a mask at all times, including in the patient's room. Patients must also wear a mask when staff or their support person are in the room. Visitors GUEST BADGE MUST BE WORN VISIBLY  One adult visitor may remain with you overnight and MUST be in the room by 8 P.M.     Your procedure is scheduled on: 04/01/23   Report to [facility] Main Entrance    Report to admitting at : 5:15 AM   Call this number if you have problems the morning of surgery [phone]   Do not eat food :After Midnight.   After Midnight you may have the following liquids until : 4:30 AM DAY OF SURGERY  Water Black Coffee (sugar ok, NO MILK/CREAM OR CREAMERS)  Tea (sugar ok, NO MILK/CREAM OR CREAMERS) regular and decaf                             Plain Jell-O (NO RED)                                           Fruit ices (not with fruit pulp, NO RED)                                     Popsicles (NO RED)                                                                  Juice: apple, WHITE grape, WHITE cranberry Sports drinks like Gatorade (NO RED)   The day of surgery:  Drink ONE (1) Pre-Surgery Clear Ensure at : 4:30 AM the morning of surgery. Drink in one sitting. Do not sip.  This drink was given to you during your hospital  pre-op appointment visit. Nothing else to drink after completing the  Pre-Surgery Clear Ensure or G2.          If you have questions, please contact your surgeon's office.  FOLLOW ANY ADDITIONAL PRE OP INSTRUCTIONS YOU RECEIVED FROM YOUR SURGEON'S OFFICE!!!   Oral  Hygiene is also important to reduce your risk of infection.                                    Remember - BRUSH YOUR TEETH THE MORNING OF SURGERY WITH YOUR REGULAR TOOTHPASTE  DENTURES WILL BE REMOVED PRIOR TO SURGERY PLEASE DO NOT APPLY "Poly grip" OR ADHESIVES!!!   Do NOT smoke after Midnight   Take these medicines the morning of surgery  with A SIP OF WATER: NONE.                              You may not have any metal on your body including hair pins, jewelry, and body piercing             Do not wear make-up, lotions, powders, perfumes/cologne, or deodorant  Do not wear nail polish including gel and S&S, artificial/acrylic nails, or any other type of covering on natural nails including finger and toenails. If you have artificial nails, gel coating, etc. that needs to be removed by a nail salon please have this removed prior to surgery or surgery may need to be canceled/ delayed if the surgeon/ anesthesia feels like they are unable to be safely monitored.   Do not shave  48 hours prior to surgery.    Do not bring valuables to the hospital. [facility] IS NOT             RESPONSIBLE   FOR VALUABLES.   Contacts, glasses, or bridgework may not be worn into surgery.   Bring small overnight bag day of surgery.   DO NOT BRING YOUR HOME MEDICATIONS TO THE HOSPITAL. PHARMACY WILL DISPENSE MEDICATIONS LISTED ON YOUR MEDICATION LIST TO YOU DURING YOUR ADMISSION IN THE HOSPITAL!    Patients discharged on the day of surgery will not be allowed to drive home.  Someone NEEDS to stay with you for the first 24 hours after anesthesia.   Special Instructions: Bring a copy of your healthcare power of attorney and living will documents         the day of surgery if you haven't scanned them before.              Please read over the following fact sheets you were given: IF YOU HAVE QUESTIONS ABOUT YOUR PRE-OP INSTRUCTIONS PLEASE CALL [phone]      Pre-operative 5 CHG Bath Instructions   You can  play a key role in reducing the risk of infection after surgery. Your skin needs to be as free of germs as possible. You can reduce the number of germs on your skin by washing with CHG (chlorhexidine gluconate) soap before surgery. CHG is an antiseptic soap that kills germs and continues to kill germs even after washing.   DO NOT use if you have an allergy to chlorhexidine/CHG or antibacterial soaps. If your skin becomes reddened or irritated, stop using the CHG and notify one of our RNs at : [phone].   Please shower with the CHG soap starting 4 days before surgery using the following schedule:     Please keep in mind the following:  DO NOT shave, including legs and underarms, starting the day of your first shower.   You may shave your face at any point before/day of surgery.  Place clean sheets on your bed the day you start using CHG soap. Use a clean washcloth (not used since being washed) for each shower. DO NOT sleep with pets once you start using the CHG.   CHG Shower Instructions:  If you choose to wash your hair and private area, wash first with your normal shampoo/soap.  After you use shampoo/soap, rinse your hair and body thoroughly to remove shampoo/soap residue.  Turn the water OFF and apply about 3 tablespoons (45 ml) of CHG soap to a CLEAN washcloth.  Apply CHG soap ONLY FROM YOUR NECK DOWN  TO YOUR TOES (washing for 3-5 minutes)  DO NOT use CHG soap on face, private areas, open wounds, or sores.  Pay special attention to the area where your surgery is being performed.  If you are having back surgery, having someone wash your back for you may be helpful. Wait 2 minutes after CHG soap is applied, then you may rinse off the CHG soap.  Pat dry with a clean towel  Put on clean clothes/pajamas   If you choose to wear lotion, please use ONLY the CHG-compatible lotions on the back of this paper.     Additional instructions for the day of surgery: DO NOT APPLY any lotions,  deodorants, cologne, or perfumes.   Put on clean/comfortable clothes.  Brush your teeth.  Ask your nurse before applying any prescription medications to the skin.      CHG Compatible Lotions   Aveeno Moisturizing lotion  Cetaphil Moisturizing Cream  Cetaphil Moisturizing Lotion  Clairol Herbal Essence Moisturizing Lotion, Dry Skin  Clairol Herbal Essence Moisturizing Lotion, Extra Dry Skin  Clairol Herbal Essence Moisturizing Lotion, Normal Skin  Curel Age Defying Therapeutic Moisturizing Lotion with Alpha Hydroxy  Curel Extreme Care Body Lotion  Curel Soothing Hands Moisturizing Hand Lotion  Curel Therapeutic Moisturizing Cream, Fragrance-Free  Curel Therapeutic Moisturizing Lotion, Fragrance-Free  Curel Therapeutic Moisturizing Lotion, Original Formula  Eucerin Daily Replenishing Lotion  Eucerin Dry Skin Therapy Plus Alpha Hydroxy Crme  Eucerin Dry Skin Therapy Plus Alpha Hydroxy Lotion  Eucerin Original Crme  Eucerin Original Lotion  Eucerin Plus Crme Eucerin Plus Lotion  Eucerin TriLipid Replenishing Lotion  Keri Anti-Bacterial Hand Lotion  Keri Deep Conditioning Original Lotion Dry Skin Formula Softly Scented  Keri Deep Conditioning Original Lotion, Fragrance Free Sensitive Skin Formula  Keri Lotion Fast Absorbing Fragrance Free Sensitive Skin Formula  Keri Lotion Fast Absorbing Softly Scented Dry Skin Formula  Keri Original Lotion  Keri Skin Renewal Lotion Keri Silky Smooth Lotion  Keri Silky Smooth Sensitive Skin Lotion  Nivea Body Creamy Conditioning Oil  Nivea Body Extra Enriched Lotion  Nivea Body Original Lotion  Nivea Body Sheer Moisturizing Lotion Nivea Crme  Nivea Skin Firming Lotion  NutraDerm 30 Skin Lotion  NutraDerm Skin Lotion  NutraDerm Therapeutic Skin Cream  NutraDerm Therapeutic Skin Lotion  ProShield Protective Hand Cream  Provon moisturizing lotion   Incentive Spirometer  An incentive spirometer is a tool that can help keep your lungs  clear and active. This tool measures how well you are filling your lungs with each breath. Taking long deep breaths may help reverse or decrease the chance of developing breathing (pulmonary) problems (especially infection) following: A long period of time when you are unable to move or be active. BEFORE THE PROCEDURE  If the spirometer includes an indicator to show your best effort, your nurse or respiratory therapist will set it to a desired goal. If possible, sit up straight or lean slightly forward. Try not to slouch. Hold the incentive spirometer in an upright position. INSTRUCTIONS FOR USE  Sit on the edge of your bed if possible, or sit up as far as you can in bed or on a chair. Hold the incentive spirometer in an upright position. Breathe out normally. Place the mouthpiece in your mouth and seal your lips tightly around it. Breathe in slowly and as deeply as possible, raising the piston or the ball toward the top of the column. Hold your breath for 3-5 seconds or for as long as possible. Allow the piston or  ball to fall to the bottom of the column. Remove the mouthpiece from your mouth and breathe out normally. Rest for a few seconds and repeat Steps 1 through 7 at least 10 times every 1-2 hours when you are awake. Take your time and take a few normal breaths between deep breaths. The spirometer may include an indicator to show your best effort. Use the indicator as a goal to work toward during each repetition. After each set of 10 deep breaths, practice coughing to be sure your lungs are clear. If you have an incision (the cut made at the time of surgery), support your incision when coughing by placing a pillow or rolled up towels firmly against it. Once you are able to get out of bed, walk around indoors and cough well. You may stop using the incentive spirometer when instructed by your caregiver.  RISKS AND COMPLICATIONS Take your time so you do not get dizzy or light-headed. If you  are in pain, you may need to take or ask for pain medication before doing incentive spirometry. It is harder to take a deep breath if you are having pain. AFTER USE Rest and breathe slowly and easily. It can be helpful to keep track of a log of your progress. Your caregiver can provide you with a simple table to help with this. If you are using the spirometer at home, follow these instructions: SEEK MEDICAL CARE IF:  You are having difficultly using the spirometer. You have trouble using the spirometer as often as instructed. Your pain medication is not giving enough relief while using the spirometer. You develop fever of 100.5 F (38.1 C) or higher. SEEK IMMEDIATE MEDICAL CARE IF:  You cough up bloody sputum that had not been present before. You develop fever of 102 F (38.9 C) or greater. You develop worsening pain at or near the incision site. MAKE SURE YOU:  Understand these instructions. Will watch your condition. Will get help right away if you are not doing well or get worse. Document Released: 08/24/2006 Document Revised: 07/06/2011 Document Reviewed: 10/25/2006 [facility] Patient Information 2014 [place], [place].   ________________________________________________________________________

## 2023-03-24 ENCOUNTER — Other Ambulatory Visit: Payer: Self-pay

## 2023-03-24 ENCOUNTER — Encounter (HOSPITAL_COMMUNITY): Payer: BC Managed Care – PPO | Attending: Internal Medicine

## 2023-03-24 ENCOUNTER — Encounter (HOSPITAL_COMMUNITY): Payer: Self-pay | Admitting: Orthopedic Surgery

## 2023-03-24 ENCOUNTER — Other Ambulatory Visit (HOSPITAL_COMMUNITY): Payer: Self-pay | Admitting: Orthopedic Surgery

## 2023-03-30 ENCOUNTER — Encounter (HOSPITAL_COMMUNITY)
Admission: RE | Admit: 2023-03-30 | Discharge: 2023-03-30 | Disposition: A | Discharge status: Home / Self Care | Payer: BC Managed Care – PPO | Source: Ambulatory Visit | Attending: Orthopedic Surgery | Admitting: Orthopedic Surgery

## 2023-03-30 ENCOUNTER — Ambulatory Visit: Payer: Self-pay | Admitting: Student

## 2023-03-30 DIAGNOSIS — G4733 Obstructive sleep apnea (adult) (pediatric): Secondary | ICD-10-CM | POA: Diagnosis not present

## 2023-03-30 DIAGNOSIS — Z8042 Family history of malignant neoplasm of prostate: Secondary | ICD-10-CM | POA: Diagnosis not present

## 2023-03-30 DIAGNOSIS — M79661 Pain in right lower leg: Secondary | ICD-10-CM | POA: Diagnosis not present

## 2023-03-30 DIAGNOSIS — K219 Gastro-esophageal reflux disease without esophagitis: Secondary | ICD-10-CM | POA: Diagnosis not present

## 2023-03-30 DIAGNOSIS — K5900 Constipation, unspecified: Secondary | ICD-10-CM | POA: Diagnosis not present

## 2023-03-30 DIAGNOSIS — M1711 Unilateral primary osteoarthritis, right knee: Secondary | ICD-10-CM | POA: Diagnosis not present

## 2023-03-30 DIAGNOSIS — Z8616 Personal history of COVID-19: Secondary | ICD-10-CM | POA: Diagnosis not present

## 2023-03-30 DIAGNOSIS — Z833 Family history of diabetes mellitus: Secondary | ICD-10-CM | POA: Diagnosis not present

## 2023-03-30 DIAGNOSIS — M545 Low back pain, unspecified: Secondary | ICD-10-CM | POA: Diagnosis not present

## 2023-03-30 DIAGNOSIS — Z801 Family history of malignant neoplasm of trachea, bronchus and lung: Secondary | ICD-10-CM | POA: Diagnosis not present

## 2023-03-30 DIAGNOSIS — Z841 Family history of disorders of kidney and ureter: Secondary | ICD-10-CM | POA: Diagnosis not present

## 2023-03-30 DIAGNOSIS — Z881 Allergy status to other antibiotic agents status: Secondary | ICD-10-CM | POA: Diagnosis not present

## 2023-03-30 DIAGNOSIS — D649 Anemia, unspecified: Secondary | ICD-10-CM | POA: Diagnosis not present

## 2023-03-30 DIAGNOSIS — I10 Essential (primary) hypertension: Secondary | ICD-10-CM | POA: Diagnosis not present

## 2023-03-30 DIAGNOSIS — Z825 Family history of asthma and other chronic lower respiratory diseases: Secondary | ICD-10-CM | POA: Diagnosis not present

## 2023-03-30 DIAGNOSIS — G8918 Other acute postprocedural pain: Secondary | ICD-10-CM | POA: Diagnosis not present

## 2023-03-30 DIAGNOSIS — D62 Acute posthemorrhagic anemia: Secondary | ICD-10-CM | POA: Diagnosis not present

## 2023-03-30 DIAGNOSIS — Z8052 Family history of malignant neoplasm of bladder: Secondary | ICD-10-CM | POA: Diagnosis not present

## 2023-03-30 DIAGNOSIS — Z86718 Personal history of other venous thrombosis and embolism: Secondary | ICD-10-CM | POA: Diagnosis not present

## 2023-03-30 DIAGNOSIS — M79604 Pain in right leg: Secondary | ICD-10-CM | POA: Diagnosis not present

## 2023-03-30 DIAGNOSIS — E7800 Pure hypercholesterolemia, unspecified: Secondary | ICD-10-CM | POA: Diagnosis not present

## 2023-03-30 DIAGNOSIS — I872 Venous insufficiency (chronic) (peripheral): Secondary | ICD-10-CM | POA: Diagnosis not present

## 2023-03-30 DIAGNOSIS — Z86711 Personal history of pulmonary embolism: Secondary | ICD-10-CM | POA: Diagnosis not present

## 2023-03-30 DIAGNOSIS — Z800 Family history of malignant neoplasm of digestive organs: Secondary | ICD-10-CM | POA: Diagnosis not present

## 2023-03-30 DIAGNOSIS — Y792 Prosthetic and other implants, materials and accessory orthopedic devices associated with adverse incidents: Secondary | ICD-10-CM | POA: Diagnosis present

## 2023-03-30 DIAGNOSIS — K760 Fatty (change of) liver, not elsewhere classified: Secondary | ICD-10-CM | POA: Diagnosis not present

## 2023-03-30 DIAGNOSIS — Z7901 Long term (current) use of anticoagulants: Secondary | ICD-10-CM | POA: Diagnosis not present

## 2023-03-30 DIAGNOSIS — Z01818 Encounter for other preprocedural examination: Secondary | ICD-10-CM | POA: Insufficient documentation

## 2023-03-30 DIAGNOSIS — M25561 Pain in right knee: Secondary | ICD-10-CM | POA: Diagnosis not present

## 2023-03-30 DIAGNOSIS — Y831 Surgical operation with implant of artificial internal device as the cause of abnormal reaction of the patient, or of later complication, without mention of misadventure at the time of the procedure: Secondary | ICD-10-CM | POA: Diagnosis present

## 2023-03-30 DIAGNOSIS — T8484XA Pain due to internal orthopedic prosthetic devices, implants and grafts, initial encounter: Secondary | ICD-10-CM | POA: Diagnosis not present

## 2023-03-30 DIAGNOSIS — Z7982 Long term (current) use of aspirin: Secondary | ICD-10-CM | POA: Diagnosis not present

## 2023-03-30 LAB — BASIC METABOLIC PANEL
Anion gap: 7 (ref 5–15)
BUN: 17 mg/dL (ref 6–20)
CO2: 26 mmol/L (ref 22–32)
Calcium: 9.3 mg/dL (ref 8.9–10.3)
Chloride: 105 mmol/L (ref 98–111)
Creatinine, Ser: 0.58 mg/dL (ref 0.44–1.00)
GFR, Estimated: >60 mL/min (ref >60)
Glucose, Bld: 101 mg/dL — ABNORMAL HIGH (ref 70–99)
Potassium: 3.8 mmol/L (ref 3.5–5.1)
Sodium: 138 mmol/L (ref 135–145)

## 2023-03-30 LAB — CBC
HCT: 41.4 % (ref 36.0–46.0)
Hemoglobin: 13.5 g/dL (ref 12.0–15.0)
MCH: 31.7 pg (ref 26.0–34.0)
MCHC: 32.6 g/dL (ref 30.0–36.0)
MCV: 97.2 fL (ref 80.0–100.0)
Platelets: 224 10*3/uL (ref 150–400)
RBC: 4.26 MIL/uL (ref 3.87–5.11)
RDW: 12.2 % (ref 11.5–15.5)
WBC: 3.5 10*3/uL — ABNORMAL LOW (ref 4.0–10.5)
nRBC: 0 % (ref 0.0–0.2)

## 2023-03-30 LAB — SURGICAL PCR SCREEN
MRSA, PCR: NEGATIVE
Staphylococcus aureus: POSITIVE — AB

## 2023-03-30 NOTE — H&P (Signed)
TOTAL KNEE ADMISSION H&P  Patient is being admitted for right total knee arthroplasty.  Subjective:  Chief Complaint:right knee pain.  HPI: Jacqueline Willis, 57 y.o. female, has a history of pain and functional disability in the right knee due to arthritis and has failed non-surgical conservative treatments for greater than 12 weeks to includeNSAID's and/or analgesics, corticosteriod injections, viscosupplementation injections, flexibility and strengthening excercises, use of assistive devices, and activity modification.  Onset of symptoms was gradual, starting 10 years ago with rapidlly worsening course since that time. The patient noted prior procedures on the knee to include  arthroscopy on the left knee(s).  Patient currently rates pain in the left knee(s) at 10 out of 10 with activity. Patient has night pain, worsening of pain with activity and weight bearing, pain that interferes with activities of daily living, pain with passive range of motion, crepitus, and joint swelling.  Patient has evidence of subchondral cysts, subchondral sclerosis, periarticular osteophytes, and joint space narrowing by imaging studies. There is no active infection.  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   Chronic venous insufficiency 08/24/2022   OSA (obstructive sleep apnea) 08/24/2022   BMI 25.0-25.9,adult 08/24/2022   Epigastric pain 08/05/2022   History of DVT (deep vein thrombosis) 06/15/2022   History of pulmonary  embolus (PE) 06/15/2022   Dyslipidemia 06/15/2022   Anxiety 05/11/2022   Insomnia 05/11/2022   Bloating symptom 05/11/2022   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   Tightness of heel cord, left 08/20/2020   Past Medical History:  Diagnosis Date   Arthritis    Chronic venous insufficiency    Complication of anesthesia    problem breathing after colonoscopy   Dercum disease    Fatty liver disease, nonalcoholic    History of DVT (deep vein thrombosis)    History of pulmonary embolus (PE)    History of simple renal cyst    Hypercholesterolemia    OSA (obstructive sleep apnea)    Vitamin D deficiency     Past Surgical History:  Procedure Laterality Date   ABDOMINAL HYSTERECTOMY     PARTIAL   ARTHROSCOPY KNEE W/ DRILLING Right 2009   HAND TENDON SURGERY  1983   INCONTINENCE SURGERY  2010   BLADDER SLING   IVC FILTER INSERTION N/A 03/15/2023   Procedure: IVC FILTER INSERTION;  Surgeon: Maeola Harman, MD;  Location: [facility] INVASIVE CV LAB;  Service: Cardiovascular;  Laterality: N/A;   NOSE SURGERY     WISDOM TOOTH EXTRACTION      Current Outpatient Medications  Medication Sig Dispense Refill Last Dose   aspirin 81 MG chewable tablet Chew 81 mg by mouth daily.      Multiple Vitamin (MULTIVITAMIN) tablet Take 1 tablet by mouth daily.      No current facility-administered medications for this visit.   Allergies  Allergen Reactions   Sulfa Antibiotics Anaphylaxis   Vancomycin Itching    Social History   Tobacco Use   Smoking status: Never   Smokeless tobacco: Never  Substance Use Topics  Alcohol use: Never    Family History  Problem Relation Age of Onset   Diabetes Mother    Pancreatic cancer Mother    Renal Disease Mother    Bladder Cancer Father    COPD Father    Emphysema Father    Lung cancer Sister    Prostate cancer Brother      Review of Systems  Musculoskeletal:  Positive for arthralgias, gait problem and joint swelling.  All other systems reviewed  and are negative.   Objective:  Physical Exam Constitutional:      Appearance: Normal appearance.  HENT:     Head: Normocephalic and atraumatic.     Nose: Nose normal.     Mouth/Throat:     Mouth: Mucous membranes are moist.     Pharynx: Oropharynx is clear.  Eyes:     Conjunctiva/sclera: Conjunctivae normal.  Cardiovascular:     Rate and Rhythm: Normal rate and regular rhythm.     Pulses: Normal pulses.     Heart sounds: Normal heart sounds.  Pulmonary:     Effort: Pulmonary effort is normal.     Breath sounds: Normal breath sounds.  Abdominal:     General: Abdomen is flat.     Palpations: Abdomen is soft.  Genitourinary:    Comments: Deferred. Musculoskeletal:     Cervical back: Normal range of motion and neck supple.     Comments: Examination of the right knee reveals no skin wounds or lesions. She has swelling, trace effusion. No warmth or erythema. Valgus deformity. Tenderness palpation lateral joint line and peripatellar retinacular tissues with a positive grind side. Range of motion is 0 to 120 degrees without any ligamentous instability. She does have clunk over the lateral joint line with range of motion.   Neurovascular intact distally.  Skin:    General: Skin is warm and dry.     Capillary Refill: Capillary refill takes less than 2 seconds.  Neurological:     General: No focal deficit present.     Mental Status: She is alert and oriented to person, place, and time.  Psychiatric:        Mood and Affect: Mood normal.        Behavior: Behavior normal.        Thought Content: Thought content normal.        Judgment: Judgment normal.     Vital signs in last 24 hours: @VSRANGES @  Labs:   Estimated body mass index is 27.46 kg/m as calculated from the following:   Height as of an earlier encounter on 03/30/23: 5\' 4"  (1.626 m).   Weight as of an earlier encounter on 03/30/23: 72.6 kg.   Imaging Review Plain radiographs demonstrate severe degenerative joint  disease of the right knee(s). The overall alignment issignificant valgus. The bone quality appears to be adequate for age and reported activity level.      Assessment/Plan:  End stage arthritis, right knee   The patient history, physical examination, clinical judgment of the provider and imaging studies are consistent with end stage degenerative joint disease of the right knee(s) and total knee arthroplasty is deemed medically necessary. The treatment options including medical management, injection therapy arthroscopy and arthroplasty were discussed at length. The risks and benefits of total knee arthroplasty were presented and reviewed. The risks due to aseptic loosening, infection, stiffness, patella tracking problems, thromboembolic complications and other imponderables were discussed. The patient acknowledged the explanation, agreed to proceed with the plan and consent was signed.  Patient is being admitted for inpatient treatment for surgery, pain control, PT, OT, prophylactic antibiotics, VTE prophylaxis, progressive ambulation and ADL's and discharge planning. The patient is planning to be discharged home with OPPT.   Therapy Plans: outpatient therapy. PT 04/05/23 [facility].  Disposition: Home with husband.  Planned DVT Prophylaxis: Eliquis 2.5mg  BID DME needed: walker. Ice machine today.  PCP: Cleared.  TXA: IV Allergies:  - Sulfa antibiotics - tongue swelling.  - Vancomycin - itching.  Anesthesia Concerns: Sensitive to medications.  BMI: 26.8 Last HgbA1c: 5.4  Other: - Intermittent radicular right lower extremity pain. - History of DVT and PE. IVC filter placement 03/15/23.  - History right knee arthroscopy ~2007.  - Aspirin 81mg  baseline.  - Oxycodone, zofran, methocarbamol.  [facility].  - 03/30/23: Hgb 13.5, K+ 3.8, Cr. 0.58.     Patient's anticipated LOS is less than 2 midnights, meeting these requirements: - Younger than 47 - Lives within 1 hour of  care - Has a competent adult at home to recover with post-op recover - NO history of  - Chronic pain requiring opiods  - Diabetes  - Coronary Artery Disease  - Heart failure  - Heart attack  - Stroke  - DVT/VTE  - Cardiac arrhythmia  - Respiratory Failure/COPD  - Renal failure  - Anemia  - Advanced Liver disease

## 2023-03-30 NOTE — H&P (View-Only) (Signed)
TOTAL KNEE ADMISSION H&P  Patient is being admitted for right total knee arthroplasty.  Subjective:  Chief Complaint:right knee pain.  HPI: Jacqueline Willis, 57 y.o. female, has a history of pain and functional disability in the right knee due to arthritis and has failed non-surgical conservative treatments for greater than 12 weeks to includeNSAID's and/or analgesics, corticosteriod injections, viscosupplementation injections, flexibility and strengthening excercises, use of assistive devices, and activity modification.  Onset of symptoms was gradual, starting 10 years ago with rapidlly worsening course since that time. The patient noted prior procedures on the knee to include  arthroscopy on the left knee(s).  Patient currently rates pain in the left knee(s) at 10 out of 10 with activity. Patient has night pain, worsening of pain with activity and weight bearing, pain that interferes with activities of daily living, pain with passive range of motion, crepitus, and joint swelling.  Patient has evidence of subchondral cysts, subchondral sclerosis, periarticular osteophytes, and joint space narrowing by imaging studies. There is no active infection.  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   Chronic venous insufficiency 08/24/2022   OSA (obstructive sleep apnea) 08/24/2022   BMI 25.0-25.9,adult 08/24/2022   Epigastric pain 08/05/2022   History of DVT (deep vein thrombosis) 06/15/2022   History of pulmonary  embolus (PE) 06/15/2022   Dyslipidemia 06/15/2022   Anxiety 05/11/2022   Insomnia 05/11/2022   Bloating symptom 05/11/2022   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   Tightness of heel cord, left 08/20/2020   Past Medical History:  Diagnosis Date   Arthritis    Chronic venous insufficiency    Complication of anesthesia    problem breathing after colonoscopy   Dercum disease    Fatty liver disease, nonalcoholic    History of DVT (deep vein thrombosis)    History of pulmonary embolus (PE)    History of simple renal cyst    Hypercholesterolemia    OSA (obstructive sleep apnea)    Vitamin D deficiency     Past Surgical History:  Procedure Laterality Date   ABDOMINAL HYSTERECTOMY     PARTIAL   ARTHROSCOPY KNEE W/ DRILLING Right 2009   HAND TENDON SURGERY  1983   INCONTINENCE SURGERY  2010   BLADDER SLING   IVC FILTER INSERTION N/A 03/15/2023   Procedure: IVC FILTER INSERTION;  Surgeon: Maeola Harman, MD;  Location: [facility] INVASIVE CV LAB;  Service: Cardiovascular;  Laterality: N/A;   NOSE SURGERY     WISDOM TOOTH EXTRACTION      Current Outpatient Medications  Medication Sig Dispense Refill Last Dose   aspirin 81 MG chewable tablet Chew 81 mg by mouth daily.      Multiple Vitamin (MULTIVITAMIN) tablet Take 1 tablet by mouth daily.      No current facility-administered medications for this visit.   Allergies  Allergen Reactions   Sulfa Antibiotics Anaphylaxis   Vancomycin Itching    Social History   Tobacco Use   Smoking status: Never   Smokeless tobacco: Never  Substance Use Topics  Alcohol use: Never    Family History  Problem Relation Age of Onset   Diabetes Mother    Pancreatic cancer Mother    Renal Disease Mother    Bladder Cancer Father    COPD Father    Emphysema Father    Lung cancer Sister    Prostate cancer Brother      Review of Systems  Musculoskeletal:  Positive for arthralgias, gait problem and joint swelling.  All other systems reviewed  and are negative.   Objective:  Physical Exam Constitutional:      Appearance: Normal appearance.  HENT:     Head: Normocephalic and atraumatic.     Nose: Nose normal.     Mouth/Throat:     Mouth: Mucous membranes are moist.     Pharynx: Oropharynx is clear.  Eyes:     Conjunctiva/sclera: Conjunctivae normal.  Cardiovascular:     Rate and Rhythm: Normal rate and regular rhythm.     Pulses: Normal pulses.     Heart sounds: Normal heart sounds.  Pulmonary:     Effort: Pulmonary effort is normal.     Breath sounds: Normal breath sounds.  Abdominal:     General: Abdomen is flat.     Palpations: Abdomen is soft.  Genitourinary:    Comments: Deferred. Musculoskeletal:     Cervical back: Normal range of motion and neck supple.     Comments: Examination of the right knee reveals no skin wounds or lesions. She has swelling, trace effusion. No warmth or erythema. Valgus deformity. Tenderness palpation lateral joint line and peripatellar retinacular tissues with a positive grind side. Range of motion is 0 to 120 degrees without any ligamentous instability. She does have clunk over the lateral joint line with range of motion.   Neurovascular intact distally.  Skin:    General: Skin is warm and dry.     Capillary Refill: Capillary refill takes less than 2 seconds.  Neurological:     General: No focal deficit present.     Mental Status: She is alert and oriented to person, place, and time.  Psychiatric:        Mood and Affect: Mood normal.        Behavior: Behavior normal.        Thought Content: Thought content normal.        Judgment: Judgment normal.     Vital signs in last 24 hours: @VSRANGES @  Labs:   Estimated body mass index is 27.46 kg/m as calculated from the following:   Height as of an earlier encounter on 03/30/23: 5\' 4"  (1.626 m).   Weight as of an earlier encounter on 03/30/23: 72.6 kg.   Imaging Review Plain radiographs demonstrate severe degenerative joint  disease of the right knee(s). The overall alignment issignificant valgus. The bone quality appears to be adequate for age and reported activity level.      Assessment/Plan:  End stage arthritis, right knee   The patient history, physical examination, clinical judgment of the provider and imaging studies are consistent with end stage degenerative joint disease of the right knee(s) and total knee arthroplasty is deemed medically necessary. The treatment options including medical management, injection therapy arthroscopy and arthroplasty were discussed at length. The risks and benefits of total knee arthroplasty were presented and reviewed. The risks due to aseptic loosening, infection, stiffness, patella tracking problems, thromboembolic complications and other imponderables were discussed. The patient acknowledged the explanation, agreed to proceed with the plan and consent was signed.  Patient is being admitted for inpatient treatment for surgery, pain control, PT, OT, prophylactic antibiotics, VTE prophylaxis, progressive ambulation and ADL's and discharge planning. The patient is planning to be discharged home with OPPT.   Therapy Plans: outpatient therapy. PT 04/05/23 [facility].  Disposition: Home with husband.  Planned DVT Prophylaxis: Eliquis 2.5mg  BID DME needed: walker. Ice machine today.  PCP: Cleared.  TXA: IV Allergies:  - Sulfa antibiotics - tongue swelling.  - Vancomycin - itching.  Anesthesia Concerns: Sensitive to medications.  BMI: 26.8 Last HgbA1c: 5.4  Other: - Intermittent radicular right lower extremity pain. - History of DVT and PE. IVC filter placement 03/15/23.  - History right knee arthroscopy ~2007.  - Aspirin 81mg  baseline.  - Oxycodone, zofran, methocarbamol.  [facility].  - 03/30/23: Hgb 13.5, K+ 3.8, Cr. 0.58.     Patient's anticipated LOS is less than 2 midnights, meeting these requirements: - Younger than 47 - Lives within 1 hour of  care - Has a competent adult at home to recover with post-op recover - NO history of  - Chronic pain requiring opiods  - Diabetes  - Coronary Artery Disease  - Heart failure  - Heart attack  - Stroke  - DVT/VTE  - Cardiac arrhythmia  - Respiratory Failure/COPD  - Renal failure  - Anemia  - Advanced Liver disease

## 2023-03-30 NOTE — Progress Notes (Addendum)
For Anesthesia: PCP - Crist Fat, MD : Clearance: 02/08/23: EPIC Cardiologist - N/A  Bowel Prep reminder:  Chest x-ray -  EKG - 02/08/23 Stress Test -  ECHO - 08/02/22 Cardiac Cath -  Pacemaker/ICD device last checked: Pacemaker orders received: Device Rep notified:  Spinal Cord Stimulator:N/A  Sleep Study - Yes CPAP - NO  Fasting Blood Sugar - N/A Checks Blood Sugar _____ times a day Date and result of last Hgb A1c-  Last dose of GLP1 agonist- N/A GLP1 instructions:   Last dose of SGLT-2 inhibitors- N/A SGLT-2 instructions:   Blood Thinner Instructions: N/A Aspirin Instructions: Last Dose:  Activity level: Can go up a flight of stairs and activities of daily living without stopping and without chest pain and/or shortness of breath   Able to exercise without chest pain and/or shortness of breath  Anesthesia review: Hx: DVT,PE,OSA(NO CPAP)  Patient denies shortness of breath, fever, cough and chest pain at PAT appointment   Patient verbalized understanding of instructions that were given to them at the PAT appointment. Patient was also instructed that they will need to review over the PAT instructions again at home before surgery.

## 2023-03-30 NOTE — Progress Notes (Signed)
PCR: +MRSA.

## 2023-03-31 NOTE — Anesthesia Preprocedure Evaluation (Signed)
Anesthesia Evaluation  Patient identified by MRN, date of birth, ID band Patient awake    Reviewed: Allergy & Precautions, NPO status , Patient's Chart, lab work & pertinent test results  History of Anesthesia Complications (+) history of anesthetic complications (problem breathing after colonoscopy)  Airway Mallampati: I  TM Distance: >3 FB Neck ROM: Full    Dental  (+) Dental Advisory Given   Pulmonary neg shortness of breath, sleep apnea , neg COPD, neg recent URI, PE (IVC filter placed 03/15/2023 for risk reduction prior to knee surgery)   Pulmonary exam normal breath sounds clear to auscultation       Cardiovascular (-) hypertension(-) angina + DVT (with pregnancy)  (-) Past MI, (-) Cardiac Stents and (-) CABG  Rhythm:Regular Rate:Normal  HLD   Neuro/Psych neg Seizures PSYCHIATRIC DISORDERS Anxiety      Neuromuscular disease (lumbar back pain)    GI/Hepatic ,GERD  ,,Fatty liver   Endo/Other  negative endocrine ROS    Renal/GU negative Renal ROS     Musculoskeletal  (+) Arthritis , Osteoarthritis,    Abdominal   Peds  Hematology negative hematology ROS (+) Lab Results      Component                Value               Date                      WBC                      3.5 (L)             03/30/2023                HGB                      13.5                03/30/2023                HCT                      41.4                03/30/2023                MCV                      97.2                03/30/2023                PLT                      224                 03/30/2023              Anesthesia Other Findings Takes baby aspirin - last taken 1 week ago  Reproductive/Obstetrics                             Anesthesia Physical Anesthesia Plan  ASA: 2  Anesthesia Plan: MAC and Spinal   Post-op Pain Management: Tylenol PO (pre-op)* and Regional block*   Induction:  Intravenous  PONV Risk Score and Plan:  2 and Ondansetron, Dexamethasone, Propofol infusion, TIVA and Treatment may vary due to age or medical condition  Airway Management Planned: Natural Airway and Simple Face Mask  Additional Equipment:   Intra-op Plan:   Post-operative Plan:   Informed Consent: I have reviewed the patients History and Physical, chart, labs and discussed the procedure including the risks, benefits and alternatives for the proposed anesthesia with the patient or authorized representative who has indicated his/her understanding and acceptance.     Dental advisory given  Plan Discussed with: CRNA and Anesthesiologist  Anesthesia Plan Comments: (Discussed potential risks of nerve blocks including, but not limited to, infection, bleeding, nerve damage, seizures, pneumothorax, respiratory depression, and potential failure of the block. Alternatives to nerve blocks discussed. All questions answered.  I have discussed risks of neuraxial anesthesia including but not limited to infection, bleeding, nerve injury, back pain, headache, seizures, and failure of block. Patient denies bleeding disorders and is not currently anticoagulated. Labs have been reviewed. Risks and benefits discussed. All patient's questions answered.   Discussed with patient risks of MAC including, but not limited to, minor pain or discomfort, hearing people in the room, and possible need for backup general anesthesia. Risks for general anesthesia also discussed including, but not limited to, sore throat, hoarse voice, chipped/damaged teeth, injury to vocal cords, nausea and vomiting, allergic reactions, lung infection, heart attack, stroke, and death. All questions answered. )        Anesthesia Quick Evaluation

## 2023-04-01 ENCOUNTER — Ambulatory Visit (HOSPITAL_COMMUNITY): Payer: BC Managed Care – PPO

## 2023-04-01 ENCOUNTER — Other Ambulatory Visit (HOSPITAL_COMMUNITY): Payer: Self-pay

## 2023-04-01 ENCOUNTER — Other Ambulatory Visit: Payer: Self-pay

## 2023-04-01 ENCOUNTER — Encounter (HOSPITAL_COMMUNITY)
Admission: RE | Disposition: A | Discharge status: Home / Self Care | Payer: Self-pay | Source: Ambulatory Visit | Attending: Orthopedic Surgery

## 2023-04-01 ENCOUNTER — Encounter (HOSPITAL_COMMUNITY): Payer: Self-pay | Admitting: Orthopedic Surgery

## 2023-04-01 ENCOUNTER — Ambulatory Visit (HOSPITAL_COMMUNITY)
Admission: RE | Admit: 2023-04-01 | Discharge: 2023-04-01 | Disposition: A | Discharge status: Home / Self Care | Payer: BC Managed Care – PPO | Source: Ambulatory Visit | Attending: Orthopedic Surgery | Admitting: Orthopedic Surgery

## 2023-04-01 DIAGNOSIS — Z7982 Long term (current) use of aspirin: Secondary | ICD-10-CM | POA: Insufficient documentation

## 2023-04-01 DIAGNOSIS — G8918 Other acute postprocedural pain: Secondary | ICD-10-CM | POA: Diagnosis not present

## 2023-04-01 DIAGNOSIS — Z86718 Personal history of other venous thrombosis and embolism: Secondary | ICD-10-CM

## 2023-04-01 DIAGNOSIS — Z01818 Encounter for other preprocedural examination: Secondary | ICD-10-CM

## 2023-04-01 DIAGNOSIS — M1711 Unilateral primary osteoarthritis, right knee: Secondary | ICD-10-CM | POA: Insufficient documentation

## 2023-04-01 DIAGNOSIS — K760 Fatty (change of) liver, not elsewhere classified: Secondary | ICD-10-CM | POA: Insufficient documentation

## 2023-04-01 HISTORY — DX: Lipomatosis, not elsewhere classified: E88.2

## 2023-04-01 HISTORY — DX: Other complications of anesthesia, initial encounter: T88.59XA

## 2023-04-01 HISTORY — PX: KNEE ARTHROPLASTY: SHX992

## 2023-04-01 SURGERY — ARTHROPLASTY, KNEE, TOTAL, USING IMAGELESS COMPUTER-ASSISTED NAVIGATION
Anesthesia: Monitor Anesthesia Care | Site: Knee | Laterality: Right

## 2023-04-01 MED ORDER — KETOROLAC TROMETHAMINE 30 MG/ML IJ SOLN
INTRAMUSCULAR | Status: AC
Start: 2023-04-01 — End: ?
  Filled 2023-04-01: qty 1

## 2023-04-01 MED ORDER — PHENYLEPHRINE HCL-NACL 20-0.9 MG/250ML-% IV SOLN
INTRAVENOUS | Status: DC | PRN
  Administered 2023-04-01: 25 ug/min via INTRAVENOUS

## 2023-04-01 MED ORDER — 0.9 % SODIUM CHLORIDE (POUR BTL) OPTIME
TOPICAL | Status: DC | PRN
  Administered 2023-04-01: 1000 mL

## 2023-04-01 MED ORDER — KETOROLAC TROMETHAMINE 15 MG/ML IJ SOLN
INTRAMUSCULAR | Status: AC
  Administered 2023-04-01: 15 mg via INTRAVENOUS
  Filled 2023-04-01: qty 1

## 2023-04-01 MED ORDER — OXYCODONE HCL 5 MG PO TABS
5.0000 mg | ORAL_TABLET | ORAL | 0 refills | Status: DC | PRN
  Filled 2023-04-01: qty 40, 7d supply, fill #0

## 2023-04-01 MED ORDER — DOCUSATE SODIUM 100 MG PO CAPS
100.0000 mg | ORAL_CAPSULE | Freq: Two times a day (BID) | ORAL | 0 refills | Status: DC
  Filled 2023-04-01: qty 60, 30d supply, fill #0

## 2023-04-01 MED ORDER — SODIUM CHLORIDE 0.9 % IR SOLN
Status: DC | PRN
  Administered 2023-04-01: 250 mL
  Administered 2023-04-01: 1000 mL

## 2023-04-01 MED ORDER — OXYCODONE HCL 5 MG PO TABS: 10.0000 mg | ORAL_TABLET | ORAL | Status: DC | PRN

## 2023-04-01 MED ORDER — ACETAMINOPHEN 325 MG PO TABS: 325.0000 mg | ORAL_TABLET | Freq: Four times a day (QID) | ORAL | Status: DC | PRN

## 2023-04-01 MED ORDER — OXYCODONE HCL 5 MG PO TABS
ORAL_TABLET | ORAL | Status: AC
  Filled 2023-04-01: qty 2

## 2023-04-01 MED ORDER — CEFAZOLIN SODIUM-DEXTROSE 2-4 GM/100ML-% IV SOLN
2.0000 g | INTRAVENOUS | Status: AC
  Administered 2023-04-01: 2 g via INTRAVENOUS
  Filled 2023-04-01: qty 100

## 2023-04-01 MED ORDER — CEFAZOLIN SODIUM-DEXTROSE 2-4 GM/100ML-% IV SOLN
INTRAVENOUS | Status: AC
  Filled 2023-04-01: qty 100

## 2023-04-01 MED ORDER — MIDAZOLAM HCL 5 MG/5ML IJ SOLN
INTRAMUSCULAR | Status: DC | PRN
  Administered 2023-04-01: 2 mg via INTRAVENOUS

## 2023-04-01 MED ORDER — SENNA 8.6 MG PO TABS
2.0000 | ORAL_TABLET | Freq: Every day | ORAL | 0 refills | Status: DC
Start: 2023-04-01 — End: 2023-04-07
  Filled 2023-04-01: qty 30, 15d supply, fill #0

## 2023-04-01 MED ORDER — HYDROMORPHONE HCL 1 MG/ML IJ SOLN: 0.5000 mg | INTRAMUSCULAR | Status: DC | PRN

## 2023-04-01 MED ORDER — AMISULPRIDE (ANTIEMETIC) 5 MG/2ML IV SOLN: 10.0000 mg | Freq: Once | INTRAVENOUS | Status: DC | PRN

## 2023-04-01 MED ORDER — METHOCARBAMOL 1000 MG/10ML IJ SOLN: 500.0000 mg | Freq: Four times a day (QID) | INTRAMUSCULAR | Status: DC | PRN

## 2023-04-01 MED ORDER — LACTATED RINGERS IV BOLUS: 250.0000 mL | Freq: Once | INTRAVENOUS | Status: DC

## 2023-04-01 MED ORDER — MUPIROCIN 2 % EX OINT
1.0000 | TOPICAL_OINTMENT | Freq: Two times a day (BID) | CUTANEOUS | 0 refills | Status: DC
  Filled 2023-04-01: qty 22, 11d supply, fill #0

## 2023-04-01 MED ORDER — PROPOFOL 500 MG/50ML IV EMUL
INTRAVENOUS | Status: DC | PRN
  Administered 2023-04-01: 25 ug/kg/min via INTRAVENOUS

## 2023-04-01 MED ORDER — BUPIVACAINE-EPINEPHRINE 0.25% -1:200000 IJ SOLN
INTRAMUSCULAR | Status: DC | PRN
  Administered 2023-04-01: 30 mL

## 2023-04-01 MED ORDER — MIDAZOLAM HCL 2 MG/2ML IJ SOLN
INTRAMUSCULAR | Status: AC
Start: 2023-04-01 — End: ?
  Filled 2023-04-01: qty 2

## 2023-04-01 MED ORDER — ONDANSETRON HCL 4 MG/2ML IJ SOLN
INTRAMUSCULAR | Status: DC | PRN
  Administered 2023-04-01: 4 mg via INTRAVENOUS

## 2023-04-01 MED ORDER — PROPOFOL 10 MG/ML IV BOLUS
INTRAVENOUS | Status: DC | PRN
  Administered 2023-04-01 (×2): 30 mg via INTRAVENOUS
  Administered 2023-04-01: 40 mg via INTRAVENOUS

## 2023-04-01 MED ORDER — ROPIVACAINE HCL 5 MG/ML IJ SOLN
INTRAMUSCULAR | Status: DC | PRN
  Administered 2023-04-01: 20 mL via PERINEURAL

## 2023-04-01 MED ORDER — OXYCODONE HCL 5 MG PO TABS: 5.0000 mg | ORAL_TABLET | Freq: Once | ORAL | Status: DC | PRN

## 2023-04-01 MED ORDER — LACTATED RINGERS IV SOLN: INTRAVENOUS | Status: DC

## 2023-04-01 MED ORDER — POLYETHYLENE GLYCOL 3350 17 GM/SCOOP PO POWD
17.0000 g | Freq: Every day | ORAL | 0 refills | Status: DC | PRN
  Filled 2023-04-01: qty 238, 14d supply, fill #0

## 2023-04-01 MED ORDER — METOCLOPRAMIDE HCL 5 MG PO TABS: 5.0000 mg | ORAL_TABLET | Freq: Three times a day (TID) | ORAL | Status: DC | PRN

## 2023-04-01 MED ORDER — ACETAMINOPHEN 500 MG PO TABS
ORAL_TABLET | ORAL | Status: AC
  Filled 2023-04-01: qty 2

## 2023-04-01 MED ORDER — CEFAZOLIN SODIUM-DEXTROSE 2-4 GM/100ML-% IV SOLN
2.0000 g | Freq: Four times a day (QID) | INTRAVENOUS | Status: DC
  Administered 2023-04-01: 2 g via INTRAVENOUS

## 2023-04-01 MED ORDER — ONDANSETRON HCL 4 MG/2ML IJ SOLN: 4.0000 mg | Freq: Four times a day (QID) | INTRAMUSCULAR | Status: DC | PRN

## 2023-04-01 MED ORDER — APIXABAN 2.5 MG PO TABS
2.5000 mg | ORAL_TABLET | Freq: Two times a day (BID) | ORAL | 0 refills | Status: DC
  Filled 2023-04-01: qty 60, 30d supply, fill #0

## 2023-04-01 MED ORDER — STERILE WATER FOR IRRIGATION IR SOLN
Status: DC | PRN
  Administered 2023-04-01: 1000 mL

## 2023-04-01 MED ORDER — METOCLOPRAMIDE HCL 5 MG/ML IJ SOLN: 5.0000 mg | Freq: Three times a day (TID) | INTRAMUSCULAR | Status: DC | PRN

## 2023-04-01 MED ORDER — KETOROLAC TROMETHAMINE 30 MG/ML IJ SOLN: 30.0000 mg | Freq: Once | INTRAMUSCULAR | Status: DC | PRN

## 2023-04-01 MED ORDER — ONDANSETRON HCL 4 MG PO TABS: 4.0000 mg | ORAL_TABLET | Freq: Four times a day (QID) | ORAL | Status: DC | PRN

## 2023-04-01 MED ORDER — BUPIVACAINE-EPINEPHRINE 0.25% -1:200000 IJ SOLN
INTRAMUSCULAR | Status: AC
  Filled 2023-04-01: qty 1

## 2023-04-01 MED ORDER — METHOCARBAMOL 500 MG PO TABS
500.0000 mg | ORAL_TABLET | Freq: Four times a day (QID) | ORAL | 0 refills | Status: DC | PRN
  Filled 2023-04-01: qty 20, 5d supply, fill #0

## 2023-04-01 MED ORDER — KETOROLAC TROMETHAMINE 30 MG/ML IJ SOLN
INTRAMUSCULAR | Status: DC | PRN
  Administered 2023-04-01: 30 mg via INTRAVENOUS

## 2023-04-01 MED ORDER — SODIUM CHLORIDE (PF) 0.9 % IJ SOLN
INTRAMUSCULAR | Status: AC
  Filled 2023-04-01: qty 30

## 2023-04-01 MED ORDER — DEXAMETHASONE SODIUM PHOSPHATE 10 MG/ML IJ SOLN
INTRAMUSCULAR | Status: DC | PRN
  Administered 2023-04-01: 10 mg via INTRAVENOUS

## 2023-04-01 MED ORDER — POVIDONE-IODINE 10 % EX SWAB: 2.0000 | Freq: Once | CUTANEOUS | Status: DC

## 2023-04-01 MED ORDER — CHLORHEXIDINE GLUCONATE 0.12 % MT SOLN
15.0000 mL | Freq: Once | OROMUCOSAL | Status: AC
  Administered 2023-04-01: 15 mL via OROMUCOSAL

## 2023-04-01 MED ORDER — ACETAMINOPHEN 500 MG PO TABS
1000.0000 mg | ORAL_TABLET | Freq: Four times a day (QID) | ORAL | Status: DC
  Administered 2023-04-01: 1000 mg via ORAL

## 2023-04-01 MED ORDER — BUPIVACAINE IN DEXTROSE 0.75-8.25 % IT SOLN
INTRATHECAL | Status: DC | PRN
  Administered 2023-04-01: 1.6 mL via INTRATHECAL

## 2023-04-01 MED ORDER — ORAL CARE MOUTH RINSE: 15.0000 mL | Freq: Once | OROMUCOSAL | Status: AC

## 2023-04-01 MED ORDER — TRANEXAMIC ACID-NACL 1000-0.7 MG/100ML-% IV SOLN
1000.0000 mg | INTRAVENOUS | Status: AC
  Administered 2023-04-01: 1000 mg via INTRAVENOUS
  Filled 2023-04-01: qty 100

## 2023-04-01 MED ORDER — METHOCARBAMOL 500 MG PO TABS: 500.0000 mg | ORAL_TABLET | Freq: Four times a day (QID) | ORAL | Status: DC | PRN

## 2023-04-01 MED ORDER — PROPOFOL 10 MG/ML IV BOLUS
INTRAVENOUS | Status: AC
Start: 2023-04-01 — End: ?
  Filled 2023-04-01: qty 20

## 2023-04-01 MED ORDER — OXYCODONE HCL 5 MG PO TABS
5.0000 mg | ORAL_TABLET | ORAL | Status: DC | PRN
  Administered 2023-04-01: 10 mg via ORAL

## 2023-04-01 MED ORDER — SODIUM CHLORIDE (PF) 0.9 % IJ SOLN
INTRAMUSCULAR | Status: DC | PRN
  Administered 2023-04-01: 30 mL via INTRAVENOUS

## 2023-04-01 MED ORDER — ACETAMINOPHEN 500 MG PO TABS
1000.0000 mg | ORAL_TABLET | Freq: Once | ORAL | Status: AC
  Administered 2023-04-01: 1000 mg via ORAL
  Filled 2023-04-01: qty 2

## 2023-04-01 MED ORDER — PROPOFOL 1000 MG/100ML IV EMUL
INTRAVENOUS | Status: AC
  Filled 2023-04-01: qty 100

## 2023-04-01 MED ORDER — LACTATED RINGERS IV BOLUS
500.0000 mL | Freq: Once | INTRAVENOUS | Status: AC
  Administered 2023-04-01: 500 mL via INTRAVENOUS

## 2023-04-01 MED ORDER — ISOPROPYL ALCOHOL 70 % SOLN
Status: DC | PRN
  Administered 2023-04-01: 1 via TOPICAL

## 2023-04-01 MED ORDER — FENTANYL CITRATE (PF) 100 MCG/2ML IJ SOLN
INTRAMUSCULAR | Status: DC | PRN
  Administered 2023-04-01 (×2): 25 ug via INTRAVENOUS
  Administered 2023-04-01: 50 ug via INTRAVENOUS

## 2023-04-01 MED ORDER — ONDANSETRON HCL 4 MG/2ML IJ SOLN
INTRAMUSCULAR | Status: AC
  Administered 2023-04-01: 4 mg via INTRAVENOUS
  Filled 2023-04-01: qty 2

## 2023-04-01 MED ORDER — OXYCODONE HCL 5 MG/5ML PO SOLN: 5.0000 mg | Freq: Once | ORAL | Status: DC | PRN

## 2023-04-01 MED ORDER — FENTANYL CITRATE (PF) 100 MCG/2ML IJ SOLN
INTRAMUSCULAR | Status: AC
  Filled 2023-04-01: qty 2

## 2023-04-01 MED ORDER — FENTANYL CITRATE PF 50 MCG/ML IJ SOSY: 25.0000 ug | PREFILLED_SYRINGE | INTRAMUSCULAR | Status: DC | PRN

## 2023-04-01 MED ORDER — ONDANSETRON HCL 4 MG PO TABS
4.0000 mg | ORAL_TABLET | Freq: Three times a day (TID) | ORAL | 0 refills | Status: DC | PRN
  Filled 2023-04-01: qty 30, 10d supply, fill #0

## 2023-04-01 MED ORDER — KETOROLAC TROMETHAMINE 15 MG/ML IJ SOLN: 15.0000 mg | Freq: Four times a day (QID) | INTRAMUSCULAR | Status: DC

## 2023-04-01 SURGICAL SUPPLY — 56 items
BAG COUNTER SPONGE SURGICOUNT (BAG) IMPLANT
BAG ZIPLOCK 12X15 (MISCELLANEOUS) IMPLANT
BATTERY INSTRU NAVIGATION (MISCELLANEOUS) ×3 IMPLANT
BLADE SAW RECIPROCATING 77.5 (BLADE) ×1 IMPLANT
BNDG ELASTIC 4INX 5YD STR LF (GAUZE/BANDAGES/DRESSINGS) ×1 IMPLANT
BNDG ELASTIC 6INX 5YD STR LF (GAUZE/BANDAGES/DRESSINGS) ×1 IMPLANT
CHLORAPREP W/TINT 26 (MISCELLANEOUS) ×2 IMPLANT
COMP FEM PS KNEE NRW 7 RT (Joint) ×1 IMPLANT
COMP PATELLA PEG 3 32 (Joint) ×1 IMPLANT
COMPONENT FEM PS KNEE NRW 7 RT (Joint) IMPLANT
COMPONENT PATELLA PEG 3 32 (Joint) IMPLANT
COVER SURGICAL LIGHT HANDLE (MISCELLANEOUS) ×1 IMPLANT
DERMABOND ADVANCED .7 DNX12 (GAUZE/BANDAGES/DRESSINGS) ×2 IMPLANT
DRAPE SHEET LG 3/4 BI-LAMINATE (DRAPES) ×3 IMPLANT
DRAPE U-SHAPE 47X51 STRL (DRAPES) ×1 IMPLANT
DRSG AQUACEL AG ADV 3.5X10 (GAUZE/BANDAGES/DRESSINGS) ×1 IMPLANT
ELECT BLADE TIP CTD 4 INCH (ELECTRODE) ×1 IMPLANT
ELECT REM PT RETURN 15FT ADLT (MISCELLANEOUS) ×1 IMPLANT
GAUZE SPONGE 4X4 12PLY STRL (GAUZE/BANDAGES/DRESSINGS) ×1 IMPLANT
GLOVE BIO SURGEON STRL SZ7 (GLOVE) ×1 IMPLANT
GLOVE BIO SURGEON STRL SZ8.5 (GLOVE) ×2 IMPLANT
GLOVE BIOGEL PI IND STRL 7.5 (GLOVE) ×1 IMPLANT
GLOVE BIOGEL PI IND STRL 8.5 (GLOVE) ×1 IMPLANT
GOWN SPEC L3 XXLG W/TWL (GOWN DISPOSABLE) ×1 IMPLANT
GOWN STRL REUS W/ TWL XL LVL3 (GOWN DISPOSABLE) ×1 IMPLANT
HOLDER FOLEY CATH W/STRAP (MISCELLANEOUS) ×1 IMPLANT
HOOD PEEL AWAY T7 (MISCELLANEOUS) ×3 IMPLANT
KIT TURNOVER KIT A (KITS) IMPLANT
LINER TIB ASF PS CD/3-9 10 RT (Liner) IMPLANT
MARKER SKIN DUAL TIP RULER LAB (MISCELLANEOUS) ×1 IMPLANT
NDL SAFETY ECLIPSE 18X1.5 (NEEDLE) ×1 IMPLANT
NDL SPNL 18GX3.5 QUINCKE PK (NEEDLE) ×1 IMPLANT
NEEDLE SPNL 18GX3.5 QUINCKE PK (NEEDLE) ×1 IMPLANT
NS IRRIG 1000ML POUR BTL (IV SOLUTION) ×1 IMPLANT
PACK TOTAL KNEE CUSTOM (KITS) ×1 IMPLANT
PADDING CAST COTTON 6X4 STRL (CAST SUPPLIES) ×1 IMPLANT
PIN DRILL HDLS TROCAR 75 4PK (PIN) IMPLANT
PROTECTOR NERVE ULNAR (MISCELLANEOUS) ×1 IMPLANT
SAW OSC TIP CART 19.5X105X1.3 (SAW) ×1 IMPLANT
SEALER BIPOLAR AQUA 6.0 (INSTRUMENTS) ×1 IMPLANT
SET HNDPC FAN SPRY TIP SCT (DISPOSABLE) ×1 IMPLANT
SET PAD KNEE POSITIONER (MISCELLANEOUS) ×1 IMPLANT
SOLUTION PRONTOSAN WOUND 350ML (IRRIGATION / IRRIGATOR) IMPLANT
SPIKE FLUID TRANSFER (MISCELLANEOUS) ×2 IMPLANT
STEM TIB PS KNEE D 0D RT (Stem) IMPLANT
SUT MNCRL AB 3-0 PS2 18 (SUTURE) ×1 IMPLANT
SUT MON AB 2-0 CT1 36 (SUTURE) ×1 IMPLANT
SUT STRATAFIX 14 PDO 48 VLT (SUTURE) ×1 IMPLANT
SUT STRATAFIX PDO 1 14 VIOLET (SUTURE) ×1 IMPLANT
SUT VIC AB 1 CTX36XBRD ANBCTR (SUTURE) ×2 IMPLANT
SUT VIC AB 2-0 CT1 TAPERPNT 27 (SUTURE) ×1 IMPLANT
SYR 3ML LL SCALE MARK (SYRINGE) ×1 IMPLANT
TOWEL GREEN STERILE FF (TOWEL DISPOSABLE) ×1 IMPLANT
TRAY FOLEY MTR SLVR 16FR STAT (SET/KITS/TRAYS/PACK) IMPLANT
TUBE SUCTION HIGH CAP CLEAR NV (SUCTIONS) ×1 IMPLANT
WATER STERILE IRR 1000ML POUR (IV SOLUTION) ×2 IMPLANT

## 2023-04-01 NOTE — Op Note (Signed)
OPERATIVE REPORT  SURGEON: Samson Frederic, MD   ASSISTANT: Clint Bolder, PA-C  PREOPERATIVE DIAGNOSIS: Primary Right knee arthritis.   POSTOPERATIVE DIAGNOSIS: Primary Right knee arthritis.   PROCEDURE: Computer assisted Right total knee arthroplasty.   IMPLANTS: Zimmer Persona PPS Cementless CR femur, size 7 Narrow. Persona 0 degree Spiked Keel OsseoTi Tibia, size D. Vivacit-E polyethelyene insert, size 10 mm, CR. OsseoTi 3-Peg patella, size 32 mm.  ANESTHESIA:  MAC, Regional, and Spinal  TOURNIQUET TIME: Not utilized.   ESTIMATED BLOOD LOSS:-350 mL    ANTIBIOTICS: 2g Ancef.  DRAINS: None.  COMPLICATIONS: None   CONDITION: PACU - hemodynamically stable.   BRIEF CLINICAL NOTE: Jacqueline Willis is a 57 y.o. female with a long-standing history of Right knee arthritis. After failing conservative management, the patient was indicated for total knee arthroplasty. The risks, benefits, and alternatives to the procedure were explained, and the patient elected to proceed.  PROCEDURE IN DETAIL: Adductor canal block was obtained in the pre-op holding area. Once inside the operative room, spinal anesthesia was obtained, and a foley catheter was inserted. The patient was then positioned and the lower extremity was prepped and draped in the normal sterile surgical fashion.  A time-out was called verifying side and site of surgery. The patient received IV antibiotics within 60 minutes of beginning the procedure. A tourniquet was not utilized.   An anterior approach to the knee was performed utilizing a midvastus arthrotomy. A medial release was performed and the patellar fat pad was excised. Stryker imageless navigation was used to cut the distal femur perpendicular to the mechanical axis. A freehand patellar resection was performed, and the patella was sized an prepared with 3 lug holes.  Nagivation was used to make a neutral proximal tibia resection, taking 3 mm of bone from the less affected  medial side with 3 degrees of slope. The menisci were excised. A spacer block was placed, and the alignment and balance in extension were confirmed.   The distal femur was sized using the 3-degree external rotation guide referencing the posterior femoral cortex. The appropriate 4-in-1 cutting block was pinned into place. Rotation was checked using Whiteside's line, the epicondylar axis, and then confirmed with a spacer block in flexion. The remaining femoral cuts were performed, taking care to protect the MCL.  The tibia was sized and the trial tray was pinned into place. The remaining trail components were inserted. The knee was stable to varus and valgus stress through a full range of motion. The patella tracked centrally, and the PCL was well balanced. The trial components were removed, and the proximal tibial surface was prepared. Final components were impacted into place. The knee was tested for a final time and found to be well balanced.   The wound was copiously irrigated with Prontosan solution and normal saline using pulse lavage.  Marcaine solution was injected into the periarticular soft tissue.  The wound was closed in layers using #1 Vicryl and Stratafix for the fascia, 2-0 Vicryl for the subcutaneous fat, 2-0 Monocryl for the deep dermal layer, 3-0 running Monocryl subcuticular Stitch, and 4-0 Monocryl stay sutures at both ends of the wound. Dermabond was applied to the skin.  Once the glue was fully dried, an Aquacell Ag and compressive dressing were applied.  The patient was transported to the recovery room in stable condition.  Sponge, needle, and instrument counts were correct at the end of the case x2.  The patient tolerated the procedure well and there were no known complications.  The aquamantis was utilized for this case to help facilitate better hemostasis as patient was felt to be at increased risk of bleeding because of complex case requiring increased OR time and/or exposure.   -minimally invasive approach.  A oscillating saw tip was utilized for this case to prevent damage to the soft tissue structures such as muscles, ligaments and tendons, and to ensure accurate bone cuts. This patient was at increased risk for above structures due to  minimally invasive approach.  Please note that a surgical assistant was a medical necessity for this procedure in order to perform it in a safe and expeditious manner. Surgical assistant was necessary to retract the ligaments and vital neurovascular structures to prevent injury to them and also necessary for proper positioning of the limb to allow for anatomic placement of the prosthesis.

## 2023-04-01 NOTE — Anesthesia Procedure Notes (Signed)
Spinal  Patient location during procedure: OR Start time: 04/01/2023 7:42 AM End time: 04/01/2023 7:45 AM Reason for block: surgical anesthesia Staffing Performed: anesthesiologist  Anesthesiologist: Linton Rump, MD Performed by: Linton Rump, MD Authorized by: Linton Rump, MD   Preanesthetic Checklist Completed: patient identified, IV checked, site marked, risks and benefits discussed, surgical consent, monitors and equipment checked, pre-op evaluation and timeout performed Spinal Block Patient position: sitting Prep: DuraPrep Patient monitoring: blood pressure and continuous pulse ox Approach: midline Location: L3-4 Injection technique: single-shot Needle Needle type: Pencan  Needle gauge: 24 G Needle length: 9 cm Additional Notes Risks and benefits of neuraxial anesthesia including, but not limited to, infection, bleeding, local anesthetic toxicity, headache, hypotension, back pain, block failure, etc. were discussed with the patient. The patient expressed understanding and consented to the procedure. I confirmed that the patient has no bleeding disorders and is not taking blood thinners. I confirmed the patient's last platelet count with the nurse. Monitors were applied. A time-out was performed immediately prior to the procedure. Sterile technique was used throughout the whole procedure.   1 attempt(s)

## 2023-04-01 NOTE — Anesthesia Procedure Notes (Signed)
Procedure Name: MAC Date/Time: 04/01/2023 7:33 AM  Performed by: Maurene Capes, CRNAPre-anesthesia Checklist: Patient identified, Emergency Drugs available, Suction available, Patient being monitored and Timeout performed Patient Re-evaluated:Patient Re-evaluated prior to induction Oxygen Delivery Method: Nasal cannula Preoxygenation: Pre-oxygenation with 100% oxygen Induction Type: IV induction Placement Confirmation: positive ETCO2 Dental Injury: Teeth and Oropharynx as per pre-operative assessment

## 2023-04-01 NOTE — Evaluation (Signed)
Physical Therapy Evaluation Patient Details Name: Jacqueline Willis MRN: [id number] DOB: 11/03/1965 Today's Date: 04/01/2023  History of Present Illness  57 yo female s/P RTKS 12/5@4 . PMH: DVT, PE, IVC filter  Clinical Impression  The patient is groggy, reports dizziness when standing. Patient with noted right leg not fully intact, buckling with WB. Patient only able to  ambulate a few feet. Will check back for readiness for Dc.   Pt admitted with above diagnosis.  Pt currently with functional limitations due to the deficits listed below (see PT Problem List). Pt will benefit from acute skilled PT to increase their independence and safety with mobility to allow discharge.         If plan is discharge home, recommend the following: A little help with walking and/or transfers;Assist for transportation;Help with stairs or ramp for entrance;Assistance with cooking/housework   Can travel by private vehicle        Equipment Recommendations Rolling walker (2 wheels)  Recommendations for Other Services       Functional Status Assessment Patient has had a recent decline in their functional status and demonstrates the ability to make significant improvements in function in a reasonable and predictable amount of time.     Precautions / Restrictions Precautions Precautions: Knee;Fall Restrictions Weight Bearing Restrictions: No      Mobility  Bed Mobility Overal bed mobility: Needs Assistance Bed Mobility: Supine to Sit     Supine to sit: Contact guard          Transfers Overall transfer level: Needs assistance Equipment used: Rolling walker (2 wheels) Transfers: Sit to/from Stand, Bed to chair/wheelchair/BSC Sit to Stand: Min assist           General transfer comment: steady support, right knee buckling, step to BSC with RW    Ambulation/Gait Ambulation/Gait assistance: Min assist Gait Distance (Feet): 8 Feet Assistive device: Rolling walker (2 wheels) Gait  Pattern/deviations: Step-to pattern, Antalgic       General Gait Details: reports dizziness and noted right knee buckling, limited distance  Stairs            Wheelchair Mobility     Tilt Bed    Modified Rankin (Stroke Patients Only)       Balance Overall balance assessment: Needs assistance   Sitting balance-Leahy Scale: Normal     Standing balance support: During functional activity, Bilateral upper extremity supported, Reliant on assistive device for balance Standing balance-Leahy Scale: Poor                               Pertinent Vitals/Pain Pain Assessment Pain Assessment: 0-10 Pain Score: 8  Pain Descriptors / Indicators: Aching Pain Intervention(s): Monitored during session, Patient requesting pain meds-RN notified    Home Living Family/patient expects to be discharged to:: Private residence Living Arrangements: Spouse/significant other Available Help at Discharge: Family Type of Home: House Home Access: Stairs to enter Entrance Stairs-Rails: None Secretary/administrator of Steps: 3   Home Layout: One level Home Equipment: None      Prior Function Prior Level of Function : Independent/Modified Independent                     Extremity/Trunk Assessment        Lower Extremity Assessment Lower Extremity Assessment: Overall WFL for tasks assessed;RLE deficits/detail RLE Deficits / Details: buckling with ambulation, still numbish    Cervical / Trunk Assessment Cervical / Trunk Assessment: Normal  Communication   Communication Communication: No apparent difficulties  Cognition Arousal: Alert Behavior During Therapy: WFL for tasks assessed/performed Overall Cognitive Status: Within Functional Limits for tasks assessed                                 General Comments: very sleepy        General Comments      Exercises     Assessment/Plan    PT Assessment Patient needs continued PT services  PT  Problem List Decreased strength;Decreased mobility;Decreased safety awareness;Decreased range of motion;Decreased knowledge of precautions;Decreased activity tolerance;Decreased balance;Pain       PT Treatment Interventions DME instruction;Therapeutic activities;Gait training;Therapeutic exercise;Patient/family education;Functional mobility training    PT Goals (Current goals can be found in the Care Plan section)  Acute Rehab PT Goals Patient Stated Goal: go home PT Goal Formulation: With patient/family Time For Goal Achievement: 04/15/23 Potential to Achieve Goals: Good    Frequency 7X/week     Co-evaluation               AM-PAC PT "6 Clicks" Mobility  Outcome Measure Help needed turning from your back to your side while in a flat bed without using bedrails?: None Help needed moving from lying on your back to sitting on the side of a flat bed without using bedrails?: None Help needed moving to and from a bed to a chair (including a wheelchair)?: None Help needed standing up from a chair using your arms (e.g., wheelchair or bedside chair)?: A Lot Help needed to walk in hospital room?: A Lot Help needed climbing 3-5 steps with a railing? : Total 6 Click Score: 17    End of Session Equipment Utilized During Treatment: Gait belt Activity Tolerance: No increased pain;Treatment limited secondary to medical complications (Comment) Patient left: in chair;with call bell/phone within reach Nurse Communication: Mobility status PT Visit Diagnosis: Unsteadiness on feet (R26.81);Muscle weakness (generalized) (M62.81);Difficulty in walking, not elsewhere classified (R26.2);Pain Pain - Right/Left: Right Pain - part of body: Knee    Time: 1610-9604 PT Time Calculation (min) (ACUTE ONLY): 42 min   Charges:   PT Evaluation $PT Eval Low Complexity: 1 Low PT Treatments $Therapeutic Activity: 8-22 mins $Self Care/Home Management: 8-22 PT General Charges $$ ACUTE PT VISIT: 1  Visit         Jacqueline Willis PT Acute Rehabilitation Services Office [phone] Weekend [phone]   Chavone, Crutcher 04/01/2023, 4:10 PM

## 2023-04-01 NOTE — Progress Notes (Signed)
Physical Therapy Treatment Patient Details Name: Jacqueline Willis MRN: [id number] DOB: [birth year] Today's Date: 04/01/2023   History of Present Illness 57 yo female s/P RTKS 12/5@4 . PMH: DVT, PE, IVC filter    PT Comments  Patient much more alert, R knee much stronger. Patient ambulated and  practiced steps with spouse assisting and patient using Rw. Patient has met goals  for DC. HEP reviewed and provided written instruction.   If plan is discharge home, recommend the following: A little help with walking and/or transfers;Assist for transportation;Help with stairs or ramp for entrance;Assistance with cooking/housework   Can travel by private vehicle        Equipment Recommendations  Rolling walker (2 wheels)    Recommendations for Other Services       Precautions / Restrictions Precautions Precautions: Knee;Fall Restrictions Weight Bearing Restrictions: No     Mobility  Bed Mobility Overal bed mobility: Needs Assistance Bed Mobility: Supine to Sit     Supine to sit: Contact guard     General bed mobility comments: inrecliner    Transfers Overall transfer level: Needs assistance Equipment used: Rolling walker (2 wheels) Transfers: Sit to/from Stand Sit to Stand: Contact guard assist           General transfer comment: from low toilet and recliner with rails    Ambulation/Gait Ambulation/Gait assistance: Contact guard assist Gait Distance (Feet): 40 Feet Assistive device: Rolling walker (2 wheels) Gait Pattern/deviations: Step-to pattern, Step-through pattern       General Gait Details: much steadier, WB tolerated on the right leg   Stairs Stairs: Yes Stairs assistance: Min assist Stair Management: No rails, Backwards, With walker Number of Stairs: 2 General stair comments: spouse present and provided  instruction   Wheelchair Mobility     Tilt Bed    Modified Rankin (Stroke Patients Only)       Balance Overall balance assessment: Mild  deficits observed, not formally tested   Sitting balance-Leahy Scale: Normal     Standing balance support: During functional activity, Bilateral upper extremity supported, Reliant on assistive device for balance Standing balance-Leahy Scale: Poor                              Cognition Arousal: Alert Behavior During Therapy: WFL for tasks assessed/performed Overall Cognitive Status: Within Functional Limits for tasks assessed                                 General Comments: much more alert        Exercises      General Comments        Pertinent Vitals/Pain Pain Assessment Pain Assessment: 0-10 Pain Score: 4  Pain Descriptors / Indicators: Aching Pain Intervention(s): Monitored during session, Premedicated before session    Home Living Family/patient expects to be discharged to:: Private residence Living Arrangements: Spouse/significant other Available Help at Discharge: Family Type of Home: House Home Access: Stairs to enter Entrance Stairs-Rails: None Secretary/administrator of Steps: 3   Home Layout: One level Home Equipment: None      Prior Function            PT Goals (current goals can now be found in the care plan section) Acute Rehab PT Goals Patient Stated Goal: go home PT Goal Formulation: With patient/family Time For Goal Achievement: 04/15/23 Potential to Achieve Goals: Good Progress towards PT goals:  Progressing toward goals    Frequency    7X/week      PT Plan      Co-evaluation              AM-PAC PT "6 Clicks" Mobility   Outcome Measure  Help needed turning from your back to your side while in a flat bed without using bedrails?: None Help needed moving from lying on your back to sitting on the side of a flat bed without using bedrails?: None Help needed moving to and from a bed to a chair (including a wheelchair)?: None Help needed standing up from a chair using your arms (e.g., wheelchair or  bedside chair)?: A Little Help needed to walk in hospital room?: A Little Help needed climbing 3-5 steps with a railing? : A Little 6 Click Score: 21    End of Session Equipment Utilized During Treatment: Gait belt Activity Tolerance: No increased pain;Treatment limited secondary to medical complications (Comment) Patient left: in chair;with call bell/phone within reach Nurse Communication: Mobility status PT Visit Diagnosis: Unsteadiness on feet (R26.81);Muscle weakness (generalized) (M62.81);Difficulty in walking, not elsewhere classified (R26.2);Pain Pain - Right/Left: Right Pain - part of body: Knee     Time: 7253-6644 PT Time Calculation (min) (ACUTE ONLY): 19 min  Charges:    $Gait Training: 8-22 mins $Therapeutic Activity: 8-22 mins $Self Care/Home Management: 8-22 PT General Charges $$ ACUTE PT VISIT: 1 Visit                     Blanchard Kelch PT Acute Rehabilitation Services Office [phone] Weekend [phone]    Camrin, Alamin 04/01/2023, 5:01 PM

## 2023-04-01 NOTE — Anesthesia Postprocedure Evaluation (Signed)
Anesthesia Post Note  Patient: Jacqueline Willis  Procedure(s) Performed: COMPUTER ASSISTED TOTAL KNEE ARTHROPLASTY (Right: Knee)     Patient location during evaluation: PACU Anesthesia Type: MAC and Spinal Level of consciousness: awake Pain management: pain level controlled Vital Signs Assessment: post-procedure vital signs reviewed and stable Respiratory status: spontaneous breathing, nonlabored ventilation and respiratory function stable Cardiovascular status: stable and blood pressure returned to baseline Postop Assessment: no apparent nausea or vomiting Anesthetic complications: no   No notable events documented.  Last Vitals:  Vitals:   04/01/23 1115 04/01/23 1130  BP: 132/80 135/81  Pulse: 88 87  Resp: 12 11  Temp:    SpO2: 99% 100%    Last Pain:  Vitals:   04/01/23 1115  TempSrc:   PainSc: Asleep    LLE Motor Response: Purposeful movement (04/01/23 1130)   RLE Motor Response: Purposeful movement (04/01/23 1130)   L Sensory Level: S1-Sole of foot, small toes (04/01/23 1130) R Sensory Level: S1-Sole of foot, small toes (04/01/23 1130)  Linton Rump

## 2023-04-01 NOTE — Anesthesia Procedure Notes (Signed)
Anesthesia Regional Block: Adductor canal block   Pre-Anesthetic Checklist: , timeout performed,  Correct Patient, Correct Site, Correct Laterality,  Correct Procedure, Correct Position, site marked,  Risks and benefits discussed,  Surgical consent,  Pre-op evaluation,  At surgeon's request and post-op pain management  Laterality: Right  Prep: chloraprep       Needles:  Injection technique: Single-shot  Needle Type: Echogenic Stimulator Needle     Needle Length: 9cm  Needle Gauge: 21     Additional Needles:   Procedures:,,,, ultrasound used (permanent image in chart),,    Narrative:  Start time: 04/01/2023 7:06 AM End time: 04/01/2023 7:09 AM Injection made incrementally with aspirations every 5 mL.  Performed by: Personally  Anesthesiologist: Linton Rump, MD  Additional Notes: Discussed risks and benefits of nerve block including, but not limited to, prolonged and/or permanent nerve injury involving sensory and/or motor function. Monitors were applied and a time-out was performed. The nerve and associated structures were visualized under ultrasound guidance. After negative aspiration, local anesthetic was slowly injected around the nerve. There was no evidence of high pressure during the procedure. There were no paresthesias. VSS remained stable and the patient tolerated the procedure well.

## 2023-04-01 NOTE — Discharge Instructions (Signed)

## 2023-04-01 NOTE — Interval H&P Note (Signed)
History and Physical Interval Note:  04/01/2023 7:06 AM  Jacqueline Willis  has presented today for surgery, with the diagnosis of Right knee osteoarthritis.  The various methods of treatment have been discussed with the patient and family. After consideration of risks, benefits and other options for treatment, the patient has consented to  Procedure(s): COMPUTER ASSISTED TOTAL KNEE ARTHROPLASTY (Right) as a surgical intervention.  The patient's history has been reviewed, patient examined, no change in status, stable for surgery.  I have reviewed the patient's chart and labs.  Questions were answered to the patient's satisfaction.     Iline Oven [name]

## 2023-04-01 NOTE — Transfer of Care (Signed)
Immediate Anesthesia Transfer of Care Note  Patient: Jacqueline Willis  Procedure(s) Performed: COMPUTER ASSISTED TOTAL KNEE ARTHROPLASTY (Right: Knee)  Patient Location: PACU  Anesthesia Type:Spinal and MAC combined with regional for post-op pain  Level of Consciousness: drowsy  Airway & Oxygen Therapy: Patient Spontanous Breathing and Patient connected to nasal cannula oxygen  Post-op Assessment: Report given to RN and Post -op Vital signs reviewed and stable  Post vital signs: Reviewed and stable  Last Vitals:  Vitals Value Taken Time  BP 120/79 04/01/23 1000  Temp 36.9 C 04/01/23 1000  Pulse 85 04/01/23 1003  Resp 9 04/01/23 1003  SpO2 100 % 04/01/23 1003  Vitals shown include unfiled device data.  Last Pain:  Vitals:   04/01/23 1000  TempSrc:   PainSc: Asleep         Complications: No notable events documented.

## 2023-04-02 ENCOUNTER — Ambulatory Visit: Payer: Self-pay

## 2023-04-02 ENCOUNTER — Encounter (HOSPITAL_COMMUNITY): Payer: Self-pay | Admitting: Orthopedic Surgery

## 2023-04-02 NOTE — Telephone Encounter (Signed)
Chief Complaint: Severe right knee pain, post op total knee done 04/01/23 Symptoms: 10/10 pain not relieved by the oxycodone Frequency: Ongoing since 0900-1000 when the nerve block went away Pertinent Negatives: Patient denies other symptoms Disposition: [] ED /[] Urgent Care (no appt availability in office) / [] Appointment(In office/virtual)/ []  [facility]/ [] Home Care/ [] Refused Recommended Disposition /[] [facility]/ []  Follow-up with PCP Additional Notes: Patient called and says she had a total knee to the right knee yesterday and was sent home with oxycodone and since the nerve block to the knee wore off, she's been in severe pain. She say she has called the surgeon's office, Dr. Linna Caprice, and no one is calling her back, she's left messages. I placed her on hold an called his office speaking to [facility], [employer] who says she will find a nurse to speak to me. Janine, Manufacturing systems engineer came on [employer] and says she spoke to Dr. Linna Caprice and says to tell the patient that she can take the oxycodone and the muscle relaxer a couple of hours apart and add Tylenol PM in between and that will help her to sleep; ice and elevate the knee. Patient advised of the above recommendations. She says she doesn't have Tylenol but excedriin PM. Advised not to take that but get Tylenol 325 mg and take 2 every 4 hours with the oxycodone. She says she's been icing, but not elevating the knee. Advised she needs to elevate it. She asked what does she do if the oxycodone doesn't help. Advised to call the office and the after hours nurse line will be able to assist and if symptoms do not improve over the weekend to go to the ED. She verbalized understanding.   Reason for Disposition  [1] SEVERE post-op pain (e.g., excruciating, pain scale 8-10) AND [2] not controlled with pain medications  Answer Assessment - Initial Assessment Questions 1. SYMPTOM: "What's the main symptom you're  concerned about?" (e.g., pain, fever, vomiting)     Pain 2. ONSET: "When did pain start?"     Started after the nerve block went away around 0900-1000 this morning 3. SURGERY: "What surgery did you have?"     Right Total knee 4. DATE of SURGERY: "When was the surgery?"      04/02/23 5. ANESTHESIA: " What type of anesthesia did you have?" (e.g., general, spinal, epidural, local)     Spinal block 6. PAIN: "Is there any pain?" If Yes, ask: "How bad is it?"  (Scale 1-10; or mild, moderate, severe)     10 7. FEVER: "Do you have a fever?" If Yes, ask: "What is your temperature, how was it measured, and when did it start?"     No 98.2 8. VOMITING: "Is there any vomiting?" If Yes, ask: "How many times?"     No 9. BLEEDING: "Is there any bleeding?" If Yes, ask: "How much?" and "Where?"     No, dressing is dry 10. OTHER SYMPTOMS: "Do you have any other symptoms?" (e.g., drainage from wound, painful urination, constipation)      Pain in upper thigh  Protocols used: Post-Op Symptoms and Questions-A-AH

## 2023-04-03 ENCOUNTER — Encounter (HOSPITAL_COMMUNITY): Payer: Self-pay

## 2023-04-03 ENCOUNTER — Emergency Department (HOSPITAL_BASED_OUTPATIENT_CLINIC_OR_DEPARTMENT_OTHER): Payer: BC Managed Care – PPO

## 2023-04-03 ENCOUNTER — Inpatient Hospital Stay (HOSPITAL_COMMUNITY)
Admission: EM | Admit: 2023-04-03 | Discharge: 2023-04-07 | DRG: 470 | Disposition: A | Discharge status: Home / Self Care | Payer: BC Managed Care – PPO | Attending: Student | Admitting: Student

## 2023-04-03 ENCOUNTER — Emergency Department (HOSPITAL_COMMUNITY): Payer: BC Managed Care – PPO

## 2023-04-03 ENCOUNTER — Other Ambulatory Visit: Payer: Self-pay

## 2023-04-03 DIAGNOSIS — G4733 Obstructive sleep apnea (adult) (pediatric): Secondary | ICD-10-CM | POA: Diagnosis present

## 2023-04-03 DIAGNOSIS — M25561 Pain in right knee: Secondary | ICD-10-CM | POA: Diagnosis present

## 2023-04-03 DIAGNOSIS — Z9889 Other specified postprocedural states: Secondary | ICD-10-CM

## 2023-04-03 DIAGNOSIS — M79604 Pain in right leg: Secondary | ICD-10-CM | POA: Diagnosis not present

## 2023-04-03 DIAGNOSIS — Z882 Allergy status to sulfonamides status: Secondary | ICD-10-CM

## 2023-04-03 DIAGNOSIS — Z8616 Personal history of COVID-19: Secondary | ICD-10-CM

## 2023-04-03 DIAGNOSIS — Z86718 Personal history of other venous thrombosis and embolism: Secondary | ICD-10-CM | POA: Diagnosis not present

## 2023-04-03 DIAGNOSIS — Z801 Family history of malignant neoplasm of trachea, bronchus and lung: Secondary | ICD-10-CM

## 2023-04-03 DIAGNOSIS — Z01818 Encounter for other preprocedural examination: Secondary | ICD-10-CM

## 2023-04-03 DIAGNOSIS — D62 Acute posthemorrhagic anemia: Secondary | ICD-10-CM | POA: Diagnosis present

## 2023-04-03 DIAGNOSIS — Z8052 Family history of malignant neoplasm of bladder: Secondary | ICD-10-CM

## 2023-04-03 DIAGNOSIS — M79661 Pain in right lower leg: Secondary | ICD-10-CM | POA: Diagnosis not present

## 2023-04-03 DIAGNOSIS — K5900 Constipation, unspecified: Secondary | ICD-10-CM | POA: Diagnosis not present

## 2023-04-03 DIAGNOSIS — Z825 Family history of asthma and other chronic lower respiratory diseases: Secondary | ICD-10-CM

## 2023-04-03 DIAGNOSIS — Z90710 Acquired absence of both cervix and uterus: Secondary | ICD-10-CM

## 2023-04-03 DIAGNOSIS — K760 Fatty (change of) liver, not elsewhere classified: Secondary | ICD-10-CM | POA: Diagnosis present

## 2023-04-03 DIAGNOSIS — Z86711 Personal history of pulmonary embolism: Secondary | ICD-10-CM | POA: Diagnosis present

## 2023-04-03 DIAGNOSIS — Z833 Family history of diabetes mellitus: Secondary | ICD-10-CM

## 2023-04-03 DIAGNOSIS — Z841 Family history of disorders of kidney and ureter: Secondary | ICD-10-CM

## 2023-04-03 DIAGNOSIS — D649 Anemia, unspecified: Secondary | ICD-10-CM | POA: Diagnosis present

## 2023-04-03 DIAGNOSIS — Z79899 Other long term (current) drug therapy: Secondary | ICD-10-CM

## 2023-04-03 DIAGNOSIS — E7800 Pure hypercholesterolemia, unspecified: Secondary | ICD-10-CM | POA: Diagnosis present

## 2023-04-03 DIAGNOSIS — Z8042 Family history of malignant neoplasm of prostate: Secondary | ICD-10-CM

## 2023-04-03 DIAGNOSIS — M545 Low back pain, unspecified: Secondary | ICD-10-CM | POA: Diagnosis present

## 2023-04-03 DIAGNOSIS — Y792 Prosthetic and other implants, materials and accessory orthopedic devices associated with adverse incidents: Secondary | ICD-10-CM | POA: Diagnosis present

## 2023-04-03 DIAGNOSIS — Y831 Surgical operation with implant of artificial internal device as the cause of abnormal reaction of the patient, or of later complication, without mention of misadventure at the time of the procedure: Secondary | ICD-10-CM | POA: Diagnosis present

## 2023-04-03 DIAGNOSIS — G8918 Other acute postprocedural pain: Principal | ICD-10-CM | POA: Diagnosis present

## 2023-04-03 DIAGNOSIS — M1711 Unilateral primary osteoarthritis, right knee: Principal | ICD-10-CM | POA: Diagnosis present

## 2023-04-03 DIAGNOSIS — Z7982 Long term (current) use of aspirin: Secondary | ICD-10-CM

## 2023-04-03 DIAGNOSIS — Z881 Allergy status to other antibiotic agents status: Secondary | ICD-10-CM

## 2023-04-03 DIAGNOSIS — Z800 Family history of malignant neoplasm of digestive organs: Secondary | ICD-10-CM

## 2023-04-03 DIAGNOSIS — I10 Essential (primary) hypertension: Secondary | ICD-10-CM | POA: Diagnosis not present

## 2023-04-03 DIAGNOSIS — Z7901 Long term (current) use of anticoagulants: Secondary | ICD-10-CM

## 2023-04-03 DIAGNOSIS — K219 Gastro-esophageal reflux disease without esophagitis: Secondary | ICD-10-CM | POA: Diagnosis present

## 2023-04-03 DIAGNOSIS — I872 Venous insufficiency (chronic) (peripheral): Secondary | ICD-10-CM | POA: Diagnosis present

## 2023-04-03 DIAGNOSIS — T8484XA Pain due to internal orthopedic prosthetic devices, implants and grafts, initial encounter: Principal | ICD-10-CM | POA: Diagnosis present

## 2023-04-03 LAB — ABO/RH: ABO/RH(D): A POS

## 2023-04-03 LAB — CBC WITH DIFFERENTIAL/PLATELET
Abs Immature Granulocytes: 0.01 10*3/uL (ref 0.00–0.07)
Basophils Absolute: 0 10*3/uL (ref 0.0–0.1)
Basophils Relative: 0 %
Eosinophils Absolute: 0 10*3/uL (ref 0.0–0.5)
Eosinophils Relative: 0 %
HCT: 22.3 % — ABNORMAL LOW (ref 36.0–46.0)
Hemoglobin: 7.2 g/dL — ABNORMAL LOW (ref 12.0–15.0)
Immature Granulocytes: 0 %
Lymphocytes Relative: 23 %
Lymphs Abs: 1.3 10*3/uL (ref 0.7–4.0)
MCH: 32.3 pg (ref 26.0–34.0)
MCHC: 32.3 g/dL (ref 30.0–36.0)
MCV: 100 fL (ref 80.0–100.0)
Monocytes Absolute: 0.7 10*3/uL (ref 0.1–1.0)
Monocytes Relative: 13 %
Neutro Abs: 3.5 10*3/uL (ref 1.7–7.7)
Neutrophils Relative %: 64 %
Platelets: 132 10*3/uL — ABNORMAL LOW (ref 150–400)
RBC: 2.23 MIL/uL — ABNORMAL LOW (ref 3.87–5.11)
RDW: 12.8 % (ref 11.5–15.5)
WBC: 5.6 10*3/uL (ref 4.0–10.5)
nRBC: 0 % (ref 0.0–0.2)

## 2023-04-03 LAB — IRON AND TIBC
Iron: 21 ug/dL — ABNORMAL LOW (ref 28–170)
Saturation Ratios: 7 % — ABNORMAL LOW (ref 10.4–31.8)
TIBC: 290 ug/dL (ref 250–450)
UIBC: 269 ug/dL

## 2023-04-03 LAB — RETICULOCYTES
Immature Retic Fract: 7.9 % (ref 2.3–15.9)
RBC.: 3.11 MIL/uL — ABNORMAL LOW (ref 3.87–5.11)
Retic Count, Absolute: 61.6 10*3/uL (ref 19.0–186.0)
Retic Ct Pct: 2 % (ref 0.4–3.1)

## 2023-04-03 LAB — FERRITIN: Ferritin: 105 ng/mL (ref 11–307)

## 2023-04-03 LAB — BASIC METABOLIC PANEL
Anion gap: 7 (ref 5–15)
BUN: 17 mg/dL (ref 6–20)
CO2: 27 mmol/L (ref 22–32)
Calcium: 7.7 mg/dL — ABNORMAL LOW (ref 8.9–10.3)
Chloride: 105 mmol/L (ref 98–111)
Creatinine, Ser: 0.63 mg/dL (ref 0.44–1.00)
GFR, Estimated: >60 mL/min (ref >60)
Glucose, Bld: 87 mg/dL (ref 70–99)
Potassium: 3.7 mmol/L (ref 3.5–5.1)
Sodium: 139 mmol/L (ref 135–145)

## 2023-04-03 LAB — TYPE AND SCREEN
ABO/RH(D): A POS
Antibody Screen: NEGATIVE

## 2023-04-03 LAB — FOLATE: Folate: 19.3 ng/mL (ref >5.9)

## 2023-04-03 LAB — VITAMIN B12: Vitamin B-12: 493 pg/mL (ref 180–914)

## 2023-04-03 MED ORDER — DOCUSATE SODIUM 100 MG PO CAPS
100.0000 mg | ORAL_CAPSULE | Freq: Two times a day (BID) | ORAL | Status: DC
  Administered 2023-04-03 – 2023-04-04 (×3): 100 mg via ORAL
  Filled 2023-04-03 (×3): qty 1

## 2023-04-03 MED ORDER — POLYETHYLENE GLYCOL 3350 17 G PO PACK
17.0000 g | PACK | Freq: Every day | ORAL | Status: DC | PRN
  Administered 2023-04-04: 17 g via ORAL
  Filled 2023-04-03: qty 1

## 2023-04-03 MED ORDER — BISACODYL 5 MG PO TBEC
5.0000 mg | DELAYED_RELEASE_TABLET | Freq: Every day | ORAL | Status: DC | PRN
  Administered 2023-04-04: 5 mg via ORAL
  Filled 2023-04-03 (×2): qty 1

## 2023-04-03 MED ORDER — SENNA 8.6 MG PO TABS
2.0000 | ORAL_TABLET | Freq: Every day | ORAL | Status: DC
  Administered 2023-04-03 – 2023-04-04 (×2): 17.2 mg via ORAL
  Filled 2023-04-03 (×2): qty 2

## 2023-04-03 MED ORDER — APIXABAN 2.5 MG PO TABS
2.5000 mg | ORAL_TABLET | Freq: Two times a day (BID) | ORAL | Status: DC
  Administered 2023-04-03 – 2023-04-07 (×7): 2.5 mg via ORAL
  Filled 2023-04-03 (×8): qty 1

## 2023-04-03 MED ORDER — ONDANSETRON HCL 4 MG/2ML IJ SOLN: 4.0000 mg | Freq: Four times a day (QID) | INTRAMUSCULAR | Status: DC | PRN

## 2023-04-03 MED ORDER — HYDROMORPHONE HCL 1 MG/ML IJ SOLN
0.5000 mg | INTRAMUSCULAR | Status: DC | PRN
  Administered 2023-04-03 – 2023-04-04 (×4): 1 mg via INTRAVENOUS
  Administered 2023-04-04: 0.5 mg via INTRAVENOUS
  Administered 2023-04-06: 1 mg via INTRAVENOUS
  Filled 2023-04-03 (×6): qty 1

## 2023-04-03 MED ORDER — ACETAMINOPHEN 650 MG RE SUPP: 650.0000 mg | Freq: Four times a day (QID) | RECTAL | Status: DC | PRN

## 2023-04-03 MED ORDER — ACETAMINOPHEN 325 MG PO TABS
650.0000 mg | ORAL_TABLET | Freq: Four times a day (QID) | ORAL | Status: DC | PRN
  Administered 2023-04-03 – 2023-04-05 (×5): 650 mg via ORAL
  Filled 2023-04-03 (×5): qty 2

## 2023-04-03 MED ORDER — ONDANSETRON HCL 4 MG PO TABS: 4.0000 mg | ORAL_TABLET | Freq: Four times a day (QID) | ORAL | Status: DC | PRN

## 2023-04-03 MED ORDER — HYDROMORPHONE HCL 1 MG/ML IJ SOLN
0.5000 mg | Freq: Once | INTRAMUSCULAR | Status: AC
  Administered 2023-04-03: 0.5 mg via INTRAVENOUS
  Filled 2023-04-03: qty 1

## 2023-04-03 MED ORDER — METHOCARBAMOL 1000 MG/10ML IJ SOLN
500.0000 mg | Freq: Four times a day (QID) | INTRAMUSCULAR | Status: DC | PRN
  Administered 2023-04-06: 500 mg via INTRAVENOUS
  Filled 2023-04-03 (×2): qty 10

## 2023-04-03 MED ORDER — ASPIRIN 81 MG PO CHEW
81.0000 mg | CHEWABLE_TABLET | Freq: Every day | ORAL | Status: DC
  Administered 2023-04-05 – 2023-04-07 (×3): 81 mg via ORAL
  Filled 2023-04-03 (×4): qty 1

## 2023-04-03 MED ORDER — OXYCODONE HCL 5 MG PO TABS
5.0000 mg | ORAL_TABLET | ORAL | Status: DC | PRN
  Administered 2023-04-04 (×4): 5 mg via ORAL
  Filled 2023-04-03 (×5): qty 1

## 2023-04-03 NOTE — Progress Notes (Signed)
Right lower extremity venous duplex has been completed. Preliminary results can be found in CV Proc through chart review.  Results were given to Evlyn Kanner PA.  04/03/23 2:01 PM Olen Cordial RVT

## 2023-04-03 NOTE — ED Provider Notes (Cosign Needed Addendum)
[facility] AT [facility] Provider Note   CSN: [id number] Arrival date & time: 04/03/23  1108     History  Chief Complaint  Patient presents with   Post-op Problem    Jacqueline Willis is a 57 y.o. 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  Patient states that she has had pain since being discharged 2 days ago from her surgery.  Patient has been taking oxycodone at home to no relief.  Patient denies fevers but states that she feels the knee is warm under the wrapping.  Patient denies paresthesias but states that she cannot bear weight.  Patient does have history of blood clots but states she was not on Eliquis until 2 days ago when she was discharged.  Patient states that the pain is a 10 out of 10 and is caused her to be nauseous without emesis.  Orthopedist: Swinteck, MD  Home Medications Prior to Admission medications   Medication Sig Start Date End Date Taking? Authorizing Provider  apixaban (ELIQUIS) 2.5 MG TABS tablet Take 1 tablet (2.5 mg total) by mouth 2 (two) times daily. 04/01/23   Clois Dupes, PA-C  aspirin 81 MG chewable tablet Chew 81 mg by mouth daily.    [provider]  docusate sodium (COLACE) 100 MG capsule Take 1 capsule (100 mg total) by mouth 2 (two) times daily. 04/01/23 05/01/23  Clois Dupes, PA-C  methocarbamol (ROBAXIN) 500 MG tablet Take 1 tablet (500 mg total) by mouth every 6 (six) hours as needed. 04/01/23   Clois Dupes, PA-C  Multiple Vitamin (MULTIVITAMIN) tablet Take 1 tablet by mouth daily.    [provider]  mupirocin ointment (BACTROBAN) 2 % Place 1 Application into the nose 2 (two) times daily. 04/01/23   Hill, Alain Honey, PA-C  ondansetron (ZOFRAN) 4 MG tablet Take 1 tablet (4 mg total) by mouth every 8 (eight) hours as needed for nausea or vomiting. 04/01/23 03/31/24  Clois Dupes, PA-C   oxyCODONE (ROXICODONE) 5 MG immediate release tablet Take 1 tablet (5 mg total) by mouth every 4 (four) hours as needed for severe pain (pain score 7-10). 04/01/23   Hill, Alain Honey, PA-C  polyethylene glycol powder (MIRALAX) 17 GM/SCOOP powder Mix 17 g in 4 oz of water or juice & take by mouth daily as needed for mild constipation or moderate constipation. 04/01/23 05/01/23  Clois Dupes, PA-C  senna (SENOKOT) 8.6 MG TABS tablet Take 2 tablets (17.2 mg total) by mouth at bedtime for 15 days. 04/01/23 04/16/23  Clois Dupes, PA-C      Allergies    Sulfa antibiotics and Vancomycin    Review of Systems   Review of Systems  Physical Exam Updated Vital Signs BP 135/60   Pulse 92   Temp 99 F (37.2 C) (Oral)   Resp 20   Ht 5\' 4"  (1.626 m)   Wt 72.6 kg   SpO2 98%   BMI 27.46 kg/m  Physical Exam Vitals reviewed.  Constitutional:      General: She is not in acute distress. Cardiovascular:     Rate and Rhythm: Normal rate.     Pulses: Normal pulses.  Musculoskeletal:     Comments: Unable to range right knee due to pain Right calf tenderness Generalized knee tenderness Able to wiggle toes in the right lower extremity Pain not out  of proportion Soft compartments Right leg does appear slightly more edematous with nonpitting edema when compared to left  Skin:    General: Skin is warm and dry.     Capillary Refill: Capillary refill takes less than 2 seconds.     Comments: Erythema around right knee, right knee is warm to palpation, no areas of fluctuance or discharge  Neurological:     Mental Status: She is alert.     Comments: Sensation intact distally  Psychiatric:        Mood and Affect: Mood normal.     ED Results / Procedures / Treatments   Labs (all labs ordered are listed, but only abnormal results are displayed) Labs Reviewed  BASIC METABOLIC PANEL - Abnormal; Notable for the following components:      Result Value   Calcium 7.7 (*)    All other components within  normal limits  CBC WITH DIFFERENTIAL/PLATELET - Abnormal; Notable for the following components:   RBC 2.23 (*)    Hemoglobin 7.2 (*)    HCT 22.3 (*)    Platelets 132 (*)    All other components within normal limits  RETICULOCYTES - Abnormal; Notable for the following components:   RBC. 3.11 (*)    All other components within normal limits  VITAMIN B12  FOLATE  IRON AND TIBC  FERRITIN  TYPE AND SCREEN  ABO/RH    EKG None  Radiology VAS [place] LOWER EXTREMITY VENOUS (DVT) (7a-7p)  Result Date: 04/03/2023  Lower Venous DVT Study Patient Name:  Jacqueline Willis  Date of Exam:   04/03/2023 Medical Rec #: [id number]        Accession #:    [id number] Date of Birth: Sep 09, 1965        Patient Gender: F Patient Age:   22 years Exam Location:  [facility] Procedure:      VAS [place] LOWER EXTREMITY VENOUS (DVT) Referring Phys: Evlyn Kanner --------------------------------------------------------------------------------  Indications: Pain.  Risk Factors: Surgery. Limitations: Poor ultrasound/tissue interface. Comparison Study: No prior studies. Performing Technologist: Chanda Busing RVT  Examination Guidelines: A complete evaluation includes B-mode imaging, spectral Doppler, color Doppler, and power Doppler as needed of all accessible portions of each vessel. Bilateral testing is considered an integral part of a complete examination. Limited examinations for reoccurring indications may be performed as noted. The reflux portion of the exam is performed with the patient in reverse Trendelenburg.  +---------+---------------+---------+-----------+----------+--------------+ RIGHT    CompressibilityPhasicitySpontaneityPropertiesThrombus Aging +---------+---------------+---------+-----------+----------+--------------+ CFV      Full           Yes      Yes                                 +---------+---------------+---------+-----------+----------+--------------+ SFJ      Full                                                         +---------+---------------+---------+-----------+----------+--------------+ FV Prox  Full                                                        +---------+---------------+---------+-----------+----------+--------------+  FV Mid   Full                                                        +---------+---------------+---------+-----------+----------+--------------+ FV Distal               Yes      Yes                                 +---------+---------------+---------+-----------+----------+--------------+ PFV      Full                                                        +---------+---------------+---------+-----------+----------+--------------+ POP      Full           Yes      Yes                                 +---------+---------------+---------+-----------+----------+--------------+ PTV      Full                                                        +---------+---------------+---------+-----------+----------+--------------+ PERO     Full                                                        +---------+---------------+---------+-----------+----------+--------------+ Gastroc  Full                                                        +---------+---------------+---------+-----------+----------+--------------+   +----+---------------+---------+-----------+----------+--------------+ LEFTCompressibilityPhasicitySpontaneityPropertiesThrombus Aging +----+---------------+---------+-----------+----------+--------------+ CFV Full           Yes      Yes                                 +----+---------------+---------+-----------+----------+--------------+    Summary: RIGHT: - There is no evidence of deep vein thrombosis in the lower extremity.  - No cystic structure found in the popliteal fossa.  LEFT: - No evidence of common femoral vein obstruction.   *See table(s) above for measurements and observations.     Preliminary    DG Knee Complete 4 Views Right  Result Date: 04/03/2023 CLINICAL DATA:  Postop right knee pain. EXAM: RIGHT KNEE - COMPLETE 4+ VIEW COMPARISON:  04/01/2023. FINDINGS: Knee prosthetic components appear well seated and aligned and unchanged. No acute fracture. No bone lesion. Possible small effusion. Joint space air noted on the prior study has mostly resorbed. Soft tissue edema is similar. IMPRESSION: 1. No  fracture or acute finding. 2. Knee prosthetic components appear well seated and aligned. Electronically Signed   By: Amie Portland M.D.   On: 04/03/2023 12:22    Procedures .Critical Care  Performed by: Netta Corrigan, PA-C Authorized by: Netta Corrigan, PA-C   Critical care provider statement:    Critical care time (minutes):  30   Critical care time was exclusive of:  Separately billable procedures and treating other patients   Critical care was necessary to treat or prevent imminent or life-threatening deterioration of the following conditions: Anemia.   Critical care was time spent personally by me on the following activities:  Blood draw for specimens, development of treatment plan with patient or surrogate, discussions with consultants, evaluation of patient's response to treatment, examination of patient, obtaining history from patient or surrogate, review of old charts, re-evaluation of patient's condition, pulse oximetry, ordering and review of radiographic studies, ordering and review of laboratory studies and ordering and performing treatments and interventions   I assumed direction of critical care for this patient from another provider in my specialty: no     Care discussed with comment:  On coming provider     Medications Ordered in ED Medications  HYDROmorphone (DILAUDID) injection 0.5 mg (0.5 mg Intravenous Given 04/03/23 1200)  HYDROmorphone (DILAUDID) injection 0.5 mg (0.5 mg Intravenous Given 04/03/23 1418)    ED Course/ Medical Decision Making/ A&P                                  Medical Decision Making Amount and/or Complexity of Data Reviewed Labs: ordered. Radiology: ordered.  Risk Prescription drug management. Decision regarding hospitalization.   Fran Lowes 57 y.o. presented today for right knee pain. Working DDx that I considered at this time includes, but not limited to, anemia, contusion, strain/sprain, fracture, dislocation, neurovascular compromise, septic joint, ischemic limb, compartment syndrome, DVT.  R/o DDx: contusion, strain/sprain, fracture, dislocation, neurovascular compromise, septic joint, ischemic limb, compartment syndrome, DVT: These are considered less likely due to history of present illness, physical exam, labs/imaging findings.  Review of prior external notes: 04/01/2023 discharge summary  Unique Tests and My Interpretation:  CBC: Hemoglobin 7.2, down from 4 days ago 13.5 BMP: Unremarkable Right knee x-ray: Unremarkable Right lower extremity DVT study: No acute findings Type and screen: Pending Ferritin:Pending Folate:Pending Reticulocytes: Unremarkable Iron and TIBC:Pending Vitamin B12:Pending  Social Determinants of Health: none  Discussion with Independent Historian:  Husband  Discussion of Management of Tests:  Ranell Patrick, MD Orthopedics; Opy[place], MD Hospitalist  Risk: High: hospitalization or escalation of hospital-level care  Risk Stratification Score: none  Staffed with Rhunette Croft, MD  Plan: On exam patient was in no acute distress with stable vitals.  On exam patient does have right calf tenderness with warmth and erythema around the right knee.  It is hard to discern if these findings with the knee are normal postsurgical findings however given patient's pain not being relieved with oxycodone at home and concerned that this could be septic however need more data.  Will obtain basic labs along with imaging.  Patient did have calf tenderness and reportedly has had multiple blood clots  in the past but was not on any blood thinners until 2 days ago when she was discharged and so we will get ultrasound as well.  Patient given pain meds here.  Patient not endorsing any chest pain or shortness of breath that would necessitate further  workup in those areas.  Patient's labs do show significant anemia from 4 days ago.  Patient denies any hematochezia or melena or hematemesis.  Patient denies any chest pain shortness of breath.  Unclear as to why patient's hemoglobin has dropped as she only lost 350 mL of blood as per the op note.  Will consult orthopedics but do anticipate admission due to significant anemia for H&H's.  Patient's pain did improve with pain medications here.  DVT study negative.  Patient's pain began to return and so she was given another round of Dilaudid.  Anemia panel was ordered.  Patient's hemoglobin is 7.2 at this time so will not transfuse.  I spoke with the [facility] physician on-call and he states that it is not unusual to have a significant drop in hemoglobin however he will consult with his colleagues and call back.  Orthopedics called me back and states that they will be available for consult and that Dr. Devonne Doughty will be by later today to evaluate the patient however patient to be admitted for pain control at this time.  Hospitalist will be consulted.  Patient stable for admission at this time.  I spoke to the hospitalist and patient was accepted for admission.  Patient stable for admission at this time.  This chart was dictated using voice recognition software.  Despite best efforts to proofread,  errors can occur which can change the documentation meaning.         Final Clinical Impression(s) / ED Diagnoses Final diagnoses:  Postoperative pain  Anemia, unspecified type    Rx / DC Orders ED Discharge Orders     None         Remi Deter 04/03/23 1504    Netta Corrigan, PA-C 04/03/23 1515    Derwood Kaplan, MD 04/04/23  [phone]

## 2023-04-03 NOTE — H&P (Addendum)
History and Physical    Jacqueline Willis ZOX:[id number] DOB: 09/04/1965 DOA: 04/03/2023  PCP: Crist Fat, MD   Patient coming from: Home   Chief Complaint: Right knee pain  HPI: Jacqueline Willis is a 57 y.o. 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   Patient reports that her pain was well-controlled initially when she returned home but soon began to worsen and is not responding to oxycodone at home.  Patient denies any trauma to the knee since returning home.  She has not noticed any bleeding but notes that she has not moved her bowels since the discharge.  She was prescribed laxative and stool softener but has not been taking them.  ED Course: Upon arrival to the ED, patient is found to be afebrile and saturating well on room air with normal heart rate and stable blood pressure.  Labs are most notable for hemoglobin 7.2.  Plain radiographs of the right knee are negative for acute findings.  No DVT is noted on ultrasound of the right lower extremity.  Orthopedic surgery (Dr. Ranell Patrick) was consulted by the ED PA and the patient was treated with 2 doses of Dilaudid.  Review of Systems:  All other systems reviewed and apart from HPI, are negative.  Past Medical History:  Diagnosis Date   Arthritis    Chronic venous insufficiency    Complication of anesthesia    problem breathing after colonoscopy   Dercum disease    Fatty liver disease, nonalcoholic    History of DVT (deep vein thrombosis)    History of pulmonary embolus (PE)    History of simple renal cyst    Hypercholesterolemia    OSA (obstructive sleep apnea)    Vitamin D deficiency     Past Surgical History:  Procedure Laterality Date   ABDOMINAL HYSTERECTOMY     PARTIAL   ARTHROSCOPY KNEE W/ DRILLING Right 2009   HAND TENDON SURGERY  1983   INCONTINENCE SURGERY  2010   BLADDER SLING    IVC FILTER INSERTION N/A 03/15/2023   Procedure: IVC FILTER INSERTION;  Surgeon: Maeola Harman, MD;  Location: [facility] INVASIVE CV LAB;  Service: Cardiovascular;  Laterality: N/A;   KNEE ARTHROPLASTY Right 04/01/2023   Procedure: COMPUTER ASSISTED TOTAL KNEE ARTHROPLASTY;  Surgeon: Samson Frederic, MD;  Location: [facility];  Service: Orthopedics;  Laterality: Right;   NOSE SURGERY     REPLACEMENT TOTAL KNEE Right    WISDOM TOOTH EXTRACTION      Social History:   reports that she has never smoked. She has never used smokeless tobacco. She reports that she does not drink alcohol and does not use drugs.  Allergies  Allergen Reactions   Sulfa Antibiotics Anaphylaxis   Vancomycin Itching    Family History  Problem Relation Age of Onset   Diabetes Mother    Pancreatic cancer Mother    Renal Disease Mother    Bladder Cancer Father    COPD Father    Emphysema Father    Lung cancer Sister    Prostate cancer Brother      Prior to Admission medications   Medication Sig Start Date End Date Taking? Authorizing Provider  apixaban (ELIQUIS) 2.5 MG TABS tablet Take 1 tablet (2.5 mg total) by mouth 2 (two) times daily. 04/01/23  Yes Hill, Alain Honey, PA-C  aspirin 81 MG chewable tablet Chew 81 mg by  mouth daily.   Yes [provider]  docusate sodium (COLACE) 100 MG capsule Take 1 capsule (100 mg total) by mouth 2 (two) times daily. 04/01/23 05/01/23 Yes Hill, Alain Honey, PA-C  methocarbamol (ROBAXIN) 500 MG tablet Take 1 tablet (500 mg total) by mouth every 6 (six) hours as needed. Patient taking differently: Take 500 mg by mouth every 6 (six) hours as needed for muscle spasms. 04/01/23  Yes Hill, Alain Honey, PA-C  Multiple Vitamin (MULTIVITAMIN) tablet Take 1 tablet by mouth daily.   Yes [provider]  mupirocin ointment (BACTROBAN) 2 % Place 1 Application into the nose 2 (two) times daily. 04/01/23  Yes Hill, Alain Honey, PA-C  ondansetron (ZOFRAN) 4 MG tablet Take 1 tablet (4 mg  total) by mouth every 8 (eight) hours as needed for nausea or vomiting. 04/01/23 03/31/24 Yes Hill, Alain Honey, PA-C  oxyCODONE (ROXICODONE) 5 MG immediate release tablet Take 1 tablet (5 mg total) by mouth every 4 (four) hours as needed for severe pain (pain score 7-10). 04/01/23  Yes Hill, Alain Honey, PA-C  polyethylene glycol powder (MIRALAX) 17 GM/SCOOP powder Mix 17 g in 4 oz of water or juice & take by mouth daily as needed for mild constipation or moderate constipation. 04/01/23 05/01/23 Yes Hill, Alain Honey, PA-C  senna (SENOKOT) 8.6 MG TABS tablet Take 2 tablets (17.2 mg total) by mouth at bedtime for 15 days. 04/01/23 04/16/23 Yes Clois Dupes, [place]    Physical Exam: Vitals:   04/03/23 1118 04/03/23 1121 04/03/23 1145 04/03/23 1400  BP:   135/60 133/69  Pulse:   92 89  Resp:   20 20  Temp:   99 F (37.2 C)   TempSrc:   Oral   SpO2:  99% 98% 95%  Weight: 72.6 kg     Height: 5\' 4"  (1.626 m)       Constitutional: NAD, no diaphoresis  Eyes: PERTLA, lids and conjunctivae normal ENMT: Mucous membranes are moist. Posterior pharynx clear of any exudate or lesions.   Neck: supple, no masses  Respiratory: no wheezing, no crackles. No accessory muscle use.  Cardiovascular: S1 & S2 heard, regular rate and rhythm. No JVD. Abdomen: No distension, no tenderness, soft. Bowel sounds active.  Musculoskeletal: no clubbing / cyanosis. No joint deformity upper and lower extremities.   Skin: no significant rashes, lesions, ulcers. Warm, dry, well-perfused. Neurologic: CN 2-12 grossly intact. Moving all extremities. Alert and oriented.  Psychiatric: Calm. Cooperative.    Labs and Imaging on Admission: I have personally reviewed following labs and imaging studies  CBC: Recent Labs  Lab 03/30/23 0820 04/03/23 1205  WBC 3.5* 5.6  NEUTROABS  --  3.5  HGB 13.5 7.2*  HCT 41.4 22.3*  MCV 97.2 100.0  PLT 224 132*   Basic Metabolic Panel: Recent Labs  Lab 03/30/23 0820 04/03/23 1205  NA 138 139  K  3.8 3.7  CL 105 105  CO2 26 27  GLUCOSE 101* 87  BUN 17 17  CREATININE 0.58 0.63  CALCIUM 9.3 7.7*   GFR: Estimated Creatinine Clearance: 74.9 mL/min (by C-G formula based on SCr of 0.63 mg/dL). Liver Function Tests: No results for input(s): "AST", "ALT", "ALKPHOS", "BILITOT", "PROT", "ALBUMIN" in the last 168 hours. No results for input(s): "LIPASE", "AMYLASE" in the last 168 hours. No results for input(s): "AMMONIA" in the last 168 hours. Coagulation Profile: No results for input(s): "INR", "PROTIME" in the last 168 hours. Cardiac Enzymes: No results for input(s): "CKTOTAL", "CKMB", "  CKMBINDEX", "TROPONINI" in the last 168 hours. BNP (last 3 results) No results for input(s): "PROBNP" in the last 8760 hours. HbA1C: No results for input(s): "HGBA1C" in the last 72 hours. CBG: No results for input(s): "GLUCAP" in the last 168 hours. Lipid Profile: No results for input(s): "CHOL", "HDL", "LDLCALC", "TRIG", "CHOLHDL", "LDLDIRECT" in the last 72 hours. Thyroid Function Tests: No results for input(s): "TSH", "T4TOTAL", "FREET4", "T3FREE", "THYROIDAB" in the last 72 hours. Anemia Panel: Recent Labs    04/03/23 1322 04/03/23 1404  VITAMINB12  --  493  FOLATE 19.3  --   FERRITIN  --  105  TIBC  --  290  IRON  --  21*  RETICCTPCT  --  2.0   Urine analysis:    Component Value Date/Time   BILIRUBINUR negative 01/25/2023 1630   PROTEINUR Negative 01/25/2023 1630   UROBILINOGEN 0.2 01/25/2023 1630   NITRITE positive 01/25/2023 1630   LEUKOCYTESUR Trace (A) 01/25/2023 1630   Sepsis Labs: @LABRCNTIP (procalcitonin:4,lacticidven:4) ) Recent Results (from the past 240 hour(s))  Surgical pcr screen     Status: Abnormal   Collection Time: 03/30/23  8:15 AM   Specimen: Nasal Mucosa; Nasal Swab  Result Value Ref Range Status   MRSA, PCR NEGATIVE NEGATIVE Final   Staphylococcus aureus POSITIVE (A) NEGATIVE Final    Comment: (NOTE) The Xpert SA Assay (FDA approved for NASAL  specimens in patients 11 years of age and older), is one component of a comprehensive surveillance program. It is not intended to diagnose infection nor to guide or monitor treatment. Performed at [facility], [address] [place], [place] [address]      Radiological Exams on Admission: VAS [place] LOWER EXTREMITY VENOUS (DVT) (7a-7p)  Result Date: 04/03/2023  Lower Venous DVT Study Patient Name:  BESTY CEBOLLERO  Date of Exam:   04/03/2023 Medical Rec #: [id number]        Accession #:    [id number] Date of Birth: [birth year]        Patient Gender: F Patient Age:   72 years Exam Location:  [facility] Procedure:      VAS [place] LOWER EXTREMITY VENOUS (DVT) Referring Phys: Evlyn Kanner --------------------------------------------------------------------------------  Indications: Pain.  Risk Factors: Surgery. Limitations: Poor ultrasound/tissue interface. Comparison Study: No prior studies. Performing Technologist: Chanda Busing RVT  Examination Guidelines: A complete evaluation includes B-mode imaging, spectral Doppler, color Doppler, and power Doppler as needed of all accessible portions of each vessel. Bilateral testing is considered an integral part of a complete examination. Limited examinations for reoccurring indications may be performed as noted. The reflux portion of the exam is performed with the patient in reverse Trendelenburg.  +---------+---------------+---------+-----------+----------+--------------+ RIGHT    CompressibilityPhasicitySpontaneityPropertiesThrombus Aging +---------+---------------+---------+-----------+----------+--------------+ CFV      Full           Yes      Yes                                 +---------+---------------+---------+-----------+----------+--------------+ SFJ      Full                                                        +---------+---------------+---------+-----------+----------+--------------+ FV Prox  Full                                                         +---------+---------------+---------+-----------+----------+--------------+  FV Mid   Full                                                        +---------+---------------+---------+-----------+----------+--------------+ FV Distal               Yes      Yes                                 +---------+---------------+---------+-----------+----------+--------------+ PFV      Full                                                        +---------+---------------+---------+-----------+----------+--------------+ POP      Full           Yes      Yes                                 +---------+---------------+---------+-----------+----------+--------------+ PTV      Full                                                        +---------+---------------+---------+-----------+----------+--------------+ PERO     Full                                                        +---------+---------------+---------+-----------+----------+--------------+ Gastroc  Full                                                        +---------+---------------+---------+-----------+----------+--------------+   +----+---------------+---------+-----------+----------+--------------+ LEFTCompressibilityPhasicitySpontaneityPropertiesThrombus Aging +----+---------------+---------+-----------+----------+--------------+ CFV Full           Yes      Yes                                 +----+---------------+---------+-----------+----------+--------------+    Summary: RIGHT: - There is no evidence of deep vein thrombosis in the lower extremity.  - No cystic structure found in the popliteal fossa.  LEFT: - No evidence of common femoral vein obstruction.   *See table(s) above for measurements and observations.    Preliminary    DG Knee Complete 4 Views Right  Result Date: 04/03/2023 CLINICAL DATA:  Postop right knee pain. EXAM: RIGHT KNEE -  COMPLETE 4+ VIEW COMPARISON:  04/01/2023. FINDINGS: Knee prosthetic components appear well seated and aligned and unchanged. No acute fracture. No bone lesion. Possible small effusion. Joint space air noted on the prior study has mostly resorbed. Soft tissue edema is similar. IMPRESSION: 1. No  fracture or acute finding. 2. Knee prosthetic components appear well seated and aligned. Electronically Signed   By: Amie Portland M.D.   On: 04/03/2023 12:22    Assessment/Plan   1. Intractable right knee pain; s/p right TKA on 04/01/23  - No acute findings on plain radiographs, no DVT on [place]  - Continue pain-control with IV agents for now, follow-up on orthopedic surgery assessment and recommendations    2. Anemia - Hgb 7.2, down from 13.5 on 03/30/23  - No overt bleeding   - Monitor, transfuse if needed    3. Hx of DVT and PE  - Continue Eliquis     DVT prophylaxis: Eliquis  Code Status: Full  Level of Care: Level of care: Med-Surg Family Communication: Husband at bedside   Disposition Plan:  Patient is from: home  Anticipated d/c is to: TBD Anticipated d/c date is: 12/8 or 04/05/23  Patient currently: pending pain-control, stable H&H  Consults called: Orthopedic surgery  Admission status: Observation     Briscoe Deutscher, MD [facility]  04/03/2023, 3:24 PM

## 2023-04-03 NOTE — ED Triage Notes (Signed)
"  From home, Total Right knee replacement on Thursday, she was sent home with pain medications but the medication is not working and she feels like something may be wrong because pain is moving up in to thigh. She is on eliquis. Gave of fentanyl in route to ED" per EMS

## 2023-04-03 NOTE — Plan of Care (Signed)
  Problem: Activity: Goal: Risk for activity intolerance will decrease Outcome: Progressing   Problem: Education: Goal: Knowledge of General Education information will improve Description: Including pain rating scale, medication(s)/side effects and non-pharmacologic comfort measures Outcome: Progressing   Problem: Pain Management: Goal: General experience of comfort will improve Outcome: Progressing

## 2023-04-03 NOTE — ED Notes (Signed)
Report called to [address]. 

## 2023-04-03 NOTE — Consult Note (Signed)
Reason for Consult:right knee pain after TKR Referring Physician: Opyd  Jacqueline Willis is an 57 y.o. female.  HPI: Patient who is three days out from TKR with same day discharge who noted increased pain and presented to the [facility] ED for pain that was poorly controlled with po oxycodone and robaxin. She is trying to do her exercises but says that pain is "nauseating." Patient noted to have acute blood loss anemia. She is admitted by medicine for pain control and serial Hgb checks.   Past Medical History:  Diagnosis Date   Arthritis    Chronic venous insufficiency    Complication of anesthesia    problem breathing after colonoscopy   Dercum disease    Fatty liver disease, nonalcoholic    History of DVT (deep vein thrombosis)    History of pulmonary embolus (PE)    History of simple renal cyst    Hypercholesterolemia    OSA (obstructive sleep apnea)    Vitamin D deficiency     Past Surgical History:  Procedure Laterality Date   ABDOMINAL HYSTERECTOMY     PARTIAL   ARTHROSCOPY KNEE W/ DRILLING Right 2009   HAND TENDON SURGERY  1983   INCONTINENCE SURGERY  2010   BLADDER SLING   IVC FILTER INSERTION N/A 03/15/2023   Procedure: IVC FILTER INSERTION;  Surgeon: Maeola Harman, MD;  Location: [facility] INVASIVE CV LAB;  Service: Cardiovascular;  Laterality: N/A;   KNEE ARTHROPLASTY Right 04/01/2023   Procedure: COMPUTER ASSISTED TOTAL KNEE ARTHROPLASTY;  Surgeon: Samson Frederic, MD;  Location: [facility] ORS;  Service: Orthopedics;  Laterality: Right;   NOSE SURGERY     REPLACEMENT TOTAL KNEE Right    WISDOM TOOTH EXTRACTION      Family History  Problem Relation Age of Onset   Diabetes Mother    Pancreatic cancer Mother    Renal Disease Mother    Bladder Cancer Father    COPD Father    Emphysema Father    Lung cancer Sister    Prostate cancer Brother     Social History:  reports that she has never smoked. She has never used smokeless tobacco. She reports that she does not drink  alcohol and does not use drugs.  Allergies:  Allergies  Allergen Reactions   Sulfa Antibiotics Anaphylaxis   Vancomycin Itching    Medications: I have reviewed the patient's current medications.  Results for orders placed or performed during the hospital encounter of 04/03/23 (from the past 48 hour(s))  Basic metabolic panel     Status: Abnormal   Collection Time: 04/03/23 12:05 PM  Result Value Ref Range   Sodium 139 135 - 145 mmol/L   Potassium 3.7 3.5 - 5.1 mmol/L   Chloride 105 98 - 111 mmol/L   CO2 27 22 - 32 mmol/L   Glucose, Bld 87 70 - 99 mg/dL    Comment: Glucose reference range applies only to samples taken after fasting for at least 8 hours.   BUN 17 6 - 20 mg/dL   Creatinine, Ser 1.61 0.44 - 1.00 mg/dL   Calcium 7.7 (L) 8.9 - 10.3 mg/dL   GFR, Estimated >09 >60 mL/min    Comment: (NOTE) Calculated using the CKD-EPI Creatinine Equation (2021)    Anion gap 7 5 - 15    Comment: Performed at [facility], [address], [place], [place] [address]  CBC with Differential     Status: Abnormal   Collection Time: 04/03/23 12:05 PM  Result Value  Ref Range   WBC 5.6 4.0 - 10.5 K/uL   RBC 2.23 (L) 3.87 - 5.11 MIL/uL   Hemoglobin 7.2 (L) 12.0 - 15.0 g/dL   HCT 16.1 (L) 09.6 - 04.5 %   MCV 100.0 80.0 - 100.0 fL   MCH 32.3 26.0 - 34.0 pg   MCHC 32.3 30.0 - 36.0 g/dL   RDW 40.9 81.1 - 91.4 %   Platelets 132 (L) 150 - 400 K/uL   nRBC 0.0 0.0 - 0.2 %   Neutrophils Relative % 64 %   Neutro Abs 3.5 1.7 - 7.7 K/uL   Lymphocytes Relative 23 %   Lymphs Abs 1.3 0.7 - 4.0 K/uL   Monocytes Relative 13 %   Monocytes Absolute 0.7 0.1 - 1.0 K/uL   Eosinophils Relative 0 %   Eosinophils Absolute 0.0 0.0 - 0.5 K/uL   Basophils Relative 0 %   Basophils Absolute 0.0 0.0 - 0.1 K/uL   Immature Granulocytes 0 %   Abs Immature Granulocytes 0.01 0.00 - 0.07 K/uL    Comment: Performed at [facility], [address], [place], [place] [address]   ABO/Rh     Status: None   Collection Time: 04/03/23 12:05 PM  Result Value Ref Range   ABO/RH(D)      A POS Performed at [facility], [address], [place], [place] [address]   Folate     Status: None   Collection Time: 04/03/23  1:22 PM  Result Value Ref Range   Folate 19.3 >5.9 ng/mL    Comment: Performed at [facility], [address] [place], [place] [address]  Vitamin B12     Status: None   Collection Time: 04/03/23  2:04 PM  Result Value Ref Range   Vitamin B-12 493 180 - 914 pg/mL    Comment: (NOTE) This assay is not validated for testing neonatal or myeloproliferative syndrome specimens for Vitamin B12 levels. Performed at [facility], [address], [place], [place] [address]   Iron and TIBC     Status: Abnormal   Collection Time: 04/03/23  2:04 PM  Result Value Ref Range   Iron 21 (L) 28 - 170 ug/dL   TIBC 952 841 - 324 ug/dL   Saturation Ratios 7 (L) 10.4 - 31.8 %   UIBC 269 ug/dL    Comment: Performed at [facility], [address], [place], [place] [address]  Ferritin     Status: None   Collection Time: 04/03/23  2:04 PM  Result Value Ref Range   Ferritin 105 11 - 307 ng/mL    Comment: Performed at [facility], [address] [place], [place] [address]  Reticulocytes     Status: Abnormal   Collection Time: 04/03/23  2:04 PM  Result Value Ref Range   Retic Ct Pct 2.0 0.4 - 3.1 %   RBC. 3.11 (L) 3.87 - 5.11 MIL/uL   Retic Count, Absolute 61.6 19.0 - 186.0 K/uL   Immature Retic Fract 7.9 2.3 - 15.9 %    Comment: Performed at [facility], [address] [place], [place] [address]  Type and screen     Status: None   Collection Time: 04/03/23  2:04 PM  Result Value Ref Range   ABO/RH(D) A POS    Antibody Screen NEG    Sample Expiration      04/06/2023,2359 Performed at [facility], [address]  [place],  [place] [address]     VAS [place] LOWER EXTREMITY VENOUS (DVT) (7a-7p)  Result Date: 04/03/2023  Lower Venous DVT Study Patient Name:  Jacqueline Willis  Date of Exam:   04/03/2023 Medical Rec #: [id number]        Accession #:    [id number] Date of Birth: 10-30-1965        Patient Gender: F Patient Age:   15 years Exam Location:  [facility] Procedure:      VAS [place] LOWER EXTREMITY VENOUS (DVT) Referring Phys: Evlyn Kanner --------------------------------------------------------------------------------  Indications: Pain.  Risk Factors: Surgery. Limitations: Poor ultrasound/tissue interface. Comparison Study: No prior studies. Performing Technologist: Chanda Busing RVT  Examination Guidelines: A complete evaluation includes B-mode imaging, spectral Doppler, color Doppler, and power Doppler as needed of all accessible portions of each vessel. Bilateral testing is considered an integral part of a complete examination. Limited examinations for reoccurring indications may be performed as noted. The reflux portion of the exam is performed with the patient in reverse Trendelenburg.  +---------+---------------+---------+-----------+----------+--------------+ RIGHT    CompressibilityPhasicitySpontaneityPropertiesThrombus Aging +---------+---------------+---------+-----------+----------+--------------+ CFV      Full           Yes      Yes                                 +---------+---------------+---------+-----------+----------+--------------+ SFJ      Full                                                        +---------+---------------+---------+-----------+----------+--------------+ FV Prox  Full                                                        +---------+---------------+---------+-----------+----------+--------------+ FV Mid   Full                                                        +---------+---------------+---------+-----------+----------+--------------+ FV Distal                Yes      Yes                                 +---------+---------------+---------+-----------+----------+--------------+ PFV      Full                                                        +---------+---------------+---------+-----------+----------+--------------+ POP      Full           Yes      Yes                                 +---------+---------------+---------+-----------+----------+--------------+  PTV      Full                                                        +---------+---------------+---------+-----------+----------+--------------+ PERO     Full                                                        +---------+---------------+---------+-----------+----------+--------------+ Gastroc  Full                                                        +---------+---------------+---------+-----------+----------+--------------+   +----+---------------+---------+-----------+----------+--------------+ LEFTCompressibilityPhasicitySpontaneityPropertiesThrombus Aging +----+---------------+---------+-----------+----------+--------------+ CFV Full           Yes      Yes                                 +----+---------------+---------+-----------+----------+--------------+    Summary: RIGHT: - There is no evidence of deep vein thrombosis in the lower extremity.  - No cystic structure found in the popliteal fossa.  LEFT: - No evidence of common femoral vein obstruction.   *See table(s) above for measurements and observations.    Preliminary    DG Knee Complete 4 Views Right  Result Date: 04/03/2023 CLINICAL DATA:  Postop right knee pain. EXAM: RIGHT KNEE - COMPLETE 4+ VIEW COMPARISON:  04/01/2023. FINDINGS: Knee prosthetic components appear well seated and aligned and unchanged. No acute fracture. No bone lesion. Possible small effusion. Joint space air noted on the prior study has mostly resorbed. Soft tissue edema is similar. IMPRESSION: 1. No fracture or  acute finding. 2. Knee prosthetic components appear well seated and aligned. Electronically Signed   By: Amie Portland M.D.   On: 04/03/2023 12:22    Review of Systems Blood pressure 112/70, pulse 90, temperature 99 F (37.2 C), resp. rate 18, height 5\' 4"  (1.626 m), weight 72.6 kg, SpO2 100%. Physical Exam Patient resting in her bed in moderate distress. Aquacel bandage intact with no spotting or active drainage. Moderate expected swelling in the right leg. Compartments are supple. No pain with ankle pumps. Good quad set.   Assessment/Plan: Patient admitted for pain management and close observation for acute blood loss anemia following TKR with uncemented TKR this well. Dr Linna Caprice aware of patient admission. Based upon surgery without a tourniquet and post op use of Eliquis for DVT/PE prophylaxis, her Hgb is not unexpected. Patient is not symptomatic from her anemia. Would recommend iron supplements and observation for now. PT for AROM and mobility. Thanks to medical team for her care.  Will follow.   Verlee Rossetti 04/03/2023, 10:20 PM

## 2023-04-03 NOTE — ED Notes (Signed)
Attempted to call report. Nurse is at lunch will call back

## 2023-04-04 DIAGNOSIS — Z841 Family history of disorders of kidney and ureter: Secondary | ICD-10-CM | POA: Diagnosis not present

## 2023-04-04 DIAGNOSIS — K760 Fatty (change of) liver, not elsewhere classified: Secondary | ICD-10-CM | POA: Diagnosis present

## 2023-04-04 DIAGNOSIS — Z7901 Long term (current) use of anticoagulants: Secondary | ICD-10-CM | POA: Diagnosis not present

## 2023-04-04 DIAGNOSIS — D649 Anemia, unspecified: Secondary | ICD-10-CM | POA: Diagnosis not present

## 2023-04-04 DIAGNOSIS — Z8042 Family history of malignant neoplasm of prostate: Secondary | ICD-10-CM | POA: Diagnosis not present

## 2023-04-04 DIAGNOSIS — Z801 Family history of malignant neoplasm of trachea, bronchus and lung: Secondary | ICD-10-CM | POA: Diagnosis not present

## 2023-04-04 DIAGNOSIS — G4733 Obstructive sleep apnea (adult) (pediatric): Secondary | ICD-10-CM | POA: Diagnosis present

## 2023-04-04 DIAGNOSIS — Z8616 Personal history of COVID-19: Secondary | ICD-10-CM | POA: Diagnosis not present

## 2023-04-04 DIAGNOSIS — I872 Venous insufficiency (chronic) (peripheral): Secondary | ICD-10-CM | POA: Diagnosis present

## 2023-04-04 DIAGNOSIS — D62 Acute posthemorrhagic anemia: Secondary | ICD-10-CM | POA: Diagnosis not present

## 2023-04-04 DIAGNOSIS — Z881 Allergy status to other antibiotic agents status: Secondary | ICD-10-CM | POA: Diagnosis not present

## 2023-04-04 DIAGNOSIS — Z86718 Personal history of other venous thrombosis and embolism: Secondary | ICD-10-CM | POA: Diagnosis not present

## 2023-04-04 DIAGNOSIS — Y792 Prosthetic and other implants, materials and accessory orthopedic devices associated with adverse incidents: Secondary | ICD-10-CM | POA: Diagnosis present

## 2023-04-04 DIAGNOSIS — Y831 Surgical operation with implant of artificial internal device as the cause of abnormal reaction of the patient, or of later complication, without mention of misadventure at the time of the procedure: Secondary | ICD-10-CM | POA: Diagnosis present

## 2023-04-04 DIAGNOSIS — Z833 Family history of diabetes mellitus: Secondary | ICD-10-CM | POA: Diagnosis not present

## 2023-04-04 DIAGNOSIS — Z8052 Family history of malignant neoplasm of bladder: Secondary | ICD-10-CM | POA: Diagnosis not present

## 2023-04-04 DIAGNOSIS — K219 Gastro-esophageal reflux disease without esophagitis: Secondary | ICD-10-CM | POA: Diagnosis present

## 2023-04-04 DIAGNOSIS — M1711 Unilateral primary osteoarthritis, right knee: Secondary | ICD-10-CM | POA: Diagnosis present

## 2023-04-04 DIAGNOSIS — K5900 Constipation, unspecified: Secondary | ICD-10-CM | POA: Diagnosis not present

## 2023-04-04 DIAGNOSIS — M545 Low back pain, unspecified: Secondary | ICD-10-CM | POA: Diagnosis present

## 2023-04-04 DIAGNOSIS — G8918 Other acute postprocedural pain: Secondary | ICD-10-CM | POA: Diagnosis present

## 2023-04-04 DIAGNOSIS — T8484XA Pain due to internal orthopedic prosthetic devices, implants and grafts, initial encounter: Secondary | ICD-10-CM | POA: Diagnosis present

## 2023-04-04 DIAGNOSIS — Z86711 Personal history of pulmonary embolism: Secondary | ICD-10-CM | POA: Diagnosis not present

## 2023-04-04 DIAGNOSIS — Z7982 Long term (current) use of aspirin: Secondary | ICD-10-CM | POA: Diagnosis not present

## 2023-04-04 DIAGNOSIS — M25561 Pain in right knee: Secondary | ICD-10-CM | POA: Diagnosis not present

## 2023-04-04 DIAGNOSIS — E7800 Pure hypercholesterolemia, unspecified: Secondary | ICD-10-CM | POA: Diagnosis present

## 2023-04-04 DIAGNOSIS — Z825 Family history of asthma and other chronic lower respiratory diseases: Secondary | ICD-10-CM | POA: Diagnosis not present

## 2023-04-04 DIAGNOSIS — Z800 Family history of malignant neoplasm of digestive organs: Secondary | ICD-10-CM | POA: Diagnosis not present

## 2023-04-04 LAB — CBC
HCT: 28.8 % — ABNORMAL LOW (ref 36.0–46.0)
HCT: 29.6 % — ABNORMAL LOW (ref 36.0–46.0)
Hemoglobin: 9.5 g/dL — ABNORMAL LOW (ref 12.0–15.0)
Hemoglobin: 9.6 g/dL — ABNORMAL LOW (ref 12.0–15.0)
MCH: 32.2 pg (ref 26.0–34.0)
MCH: 31.4 pg (ref 26.0–34.0)
MCHC: 33 g/dL (ref 30.0–36.0)
MCHC: 32.4 g/dL (ref 30.0–36.0)
MCV: 97.6 fL (ref 80.0–100.0)
MCV: 96.7 fL (ref 80.0–100.0)
Platelets: 164 10*3/uL (ref 150–400)
Platelets: 182 10*3/uL (ref 150–400)
RBC: 2.95 MIL/uL — ABNORMAL LOW (ref 3.87–5.11)
RBC: 3.06 MIL/uL — ABNORMAL LOW (ref 3.87–5.11)
RDW: 12.4 % (ref 11.5–15.5)
RDW: 12.1 % (ref 11.5–15.5)
WBC: 5.8 10*3/uL (ref 4.0–10.5)
WBC: 7.1 10*3/uL (ref 4.0–10.5)
nRBC: 0 % (ref 0.0–0.2)
nRBC: 0 % (ref 0.0–0.2)

## 2023-04-04 LAB — BASIC METABOLIC PANEL
Anion gap: 6 (ref 5–15)
BUN: 15 mg/dL (ref 6–20)
CO2: 32 mmol/L (ref 22–32)
Calcium: 8.3 mg/dL — ABNORMAL LOW (ref 8.9–10.3)
Chloride: 98 mmol/L (ref 98–111)
Creatinine, Ser: 0.7 mg/dL (ref 0.44–1.00)
GFR, Estimated: >60 mL/min (ref >60)
Glucose, Bld: 117 mg/dL — ABNORMAL HIGH (ref 70–99)
Potassium: 3.7 mmol/L (ref 3.5–5.1)
Sodium: 136 mmol/L (ref 135–145)

## 2023-04-04 LAB — HEPATIC FUNCTION PANEL
ALT: 14 U/L (ref 0–44)
AST: 18 U/L (ref 15–41)
Albumin: 2.9 g/dL — ABNORMAL LOW (ref 3.5–5.0)
Alkaline Phosphatase: 46 U/L (ref 38–126)
Bilirubin, Direct: 0.2 mg/dL (ref 0.0–0.2)
Indirect Bilirubin: 0.7 mg/dL (ref 0.3–0.9)
Total Bilirubin: 0.9 mg/dL (ref <1.2)
Total Protein: 6 g/dL — ABNORMAL LOW (ref 6.5–8.1)

## 2023-04-04 LAB — HIV ANTIBODY (ROUTINE TESTING W REFLEX): HIV Screen 4th Generation wRfx: NONREACTIVE

## 2023-04-04 MED ORDER — KETOROLAC TROMETHAMINE 15 MG/ML IJ SOLN
7.5000 mg | Freq: Four times a day (QID) | INTRAMUSCULAR | Status: AC
  Administered 2023-04-04 – 2023-04-05 (×4): 7.5 mg via INTRAVENOUS
  Filled 2023-04-04 (×4): qty 1

## 2023-04-04 NOTE — Progress Notes (Addendum)
Physical Therapy Evaluation Patient Details Name: Jacqueline Willis MRN: [id number] DOB: Nov 20, 1965 Today's Date: 04/04/2023   History of Present Illness Pt admitted from home 2* uncontrolled pain following R TKR 04/01/23 with dc home same day.    PT Comments   Pt admitted from home 2* uncontrolled pain following recent TKR and presents with functional mobility limitations 2* decreased R LE strength/ROM and post op pain. Pt should progress to dc home with family assist and reports plan is for OP PT follow up.   If plan is discharge home, recommend the following: A little help with walking and/or transfers;Assist for transportation;Help with stairs or ramp for entrance;Assistance with cooking/housework   Can travel by private vehicle        Equipment Recommendations  Rolling walker (2 wheels)    Recommendations for Other Services       Precautions / Restrictions Precautions Precautions: Knee;Fall Restrictions Weight Bearing Restrictions: No     Mobility  Bed Mobility Overal bed mobility: Needs Assistance Bed Mobility: Supine to Sit     Supine to sit: Contact guard     General bed mobility comments: Increased time with CGA for safe management of R LE    Transfers Overall transfer level: Needs assistance Equipment used: Rolling walker (2 wheels) Transfers: Sit to/from Stand Sit to Stand: Contact guard assist           General transfer comment: cues for LE management and use of UEs to self assist    Ambulation/Gait Ambulation/Gait assistance: Contact guard assist Gait Distance (Feet): 34 Feet Assistive device: Rolling walker (2 wheels) Gait Pattern/deviations: Step-to pattern, Decreased step length - right, Decreased step length - left, Shuffle, Trunk flexed Gait velocity: decr     General Gait Details: cues for sequence, posture and position from RW; distance ltd by pain   Stairs             Wheelchair Mobility     Tilt Bed    Modified Rankin  (Stroke Patients Only)       Balance Overall balance assessment: Mild deficits observed, not formally tested                                          Cognition Arousal: Alert Behavior During Therapy: WFL for tasks assessed/performed Overall Cognitive Status: Within Functional Limits for tasks assessed                                          Exercises Total Joint Exercises Ankle Circles/Pumps: AROM, Both, 15 reps, Supine Quad Sets: AROM, Both, 10 reps, Supine Heel Slides: AAROM, Right, 10 reps, Supine Straight Leg Raises: AAROM, Right, 10 reps, Supine    General Comments        Pertinent Vitals/Pain Pain Assessment Pain Assessment: 0-10 Pain Score: 7  Pain Location: R knee Pain Descriptors / Indicators: Aching, Sore, Grimacing Pain Intervention(s): Monitored during session, Limited activity within patient's tolerance, Premedicated before session, Ice applied    Home Living Family/patient expects to be discharged to:: Private residence Living Arrangements: Spouse/significant other Available Help at Discharge: Family Type of Home: House Home Access: Stairs to enter Entrance Stairs-Rails: None Entrance Stairs-Number of Steps: 3   Home Layout: One level Home Equipment: Agricultural consultant (2 wheels)      Prior  Function            PT Goals (current goals can now be found in the care plan section) Acute Rehab PT Goals Patient Stated Goal: go home PT Goal Formulation: With patient/family Time For Goal Achievement: 04/15/23 Potential to Achieve Goals: Good    Frequency    7X/week      PT Plan      Co-evaluation              AM-PAC PT "6 Clicks" Mobility   Outcome Measure  Help needed turning from your back to your side while in a flat bed without using bedrails?: A Little Help needed moving from lying on your back to sitting on the side of a flat bed without using bedrails?: A Little Help needed moving to and from  a bed to a chair (including a wheelchair)?: A Little Help needed standing up from a chair using your arms (e.g., wheelchair or bedside chair)?: A Little Help needed to walk in hospital room?: A Little Help needed climbing 3-5 steps with a railing? : A Lot 6 Click Score: 17    End of Session Equipment Utilized During Treatment: Gait belt Activity Tolerance: Patient limited by pain Patient left: in chair;with call bell/phone within reach Nurse Communication: Mobility status PT Visit Diagnosis: Unsteadiness on feet (R26.81);Muscle weakness (generalized) (M62.81);Difficulty in walking, not elsewhere classified (R26.2);Pain Pain - Right/Left: Right Pain - part of body: Knee     Time: 1000-1025 PT Time Calculation (min) (ACUTE ONLY): 25 min  Charges:    $Therapeutic Exercise: 8-22 mins PT General Charges $$ ACUTE PT VISIT: 1 Visit                     Mauro Kaufmann PT Acute Rehabilitation Services Pager [phone] Office [phone]    Ellana Kawa 04/04/2023, 1:19 PM

## 2023-04-04 NOTE — Plan of Care (Signed)
  Problem: Education: Goal: Knowledge of General Education information will improve Description: Including pain rating scale, medication(s)/side effects and non-pharmacologic comfort measures Outcome: Progressing   Problem: Activity: Goal: Risk for activity intolerance will decrease Outcome: Progressing   Problem: Pain Management: Goal: General experience of comfort will improve Outcome: Progressing

## 2023-04-04 NOTE — Progress Notes (Signed)
[facility] PROGRESS NOTE   Jacqueline Willis VWU:[id number] DOB: April 11, 1966 DOA: 04/03/2023  PCP: Crist Fat, MD  Brief History:  57 y.o. 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  Imaging studies did not show any fractures.  Orthopedics was consulted.  She was also noted to have new anemia.  She was hospitalized for further management.  Consultants: Orthopedics  Procedures: None yet    Subjective/Interval History: Pain is 8 out of 10 in intensity in the right knee.  Patient denies any bleeding episodes recently.  She does complain of constipation.  Has not had a bowel movement in the last 4 days.  Denies any abdominal pain nausea or vomiting.    Assessment/Plan:  Severe pain right knee She is status post total knee arthroplasty of the right knee on 12/5.  Imaging studies did not show any acute findings.  No DVT on ultrasound. Seen by orthopedics this morning.  Toradol has been ordered. PT and OT to evaluate.  Normocytic anemia Patient's hemoglobin was normal on 12/3 when it was 13.5.  Noted to be 7.2 yesterday.  Platelet counts were also noted to be low yesterday.  Labs were rechecked this morning and hemoglobin noted to be 9.5.  She did not receive any transfusion.  Initial sample might have been hemodiluted.  Patient denies any bleeding episodes. Platelet counts are normal this morning. Anemia panel was done which showed ferritin of 105, iron 21, TIBC 290, percent saturation 7.  Folic acid 19.3.  Vitamin B12 493. LFTs are unremarkable. Will recheck CBC later today and tomorrow.  History of PE and DVT This was several years ago.  Patient remains on Eliquis.  No DVT identified on Doppler studies done during this hospitalization.   DVT Prophylaxis: On Eliquis and aspirin Code Status: Full code Family Communication: Discussed with  patient Disposition Plan: Hopefully return home when improved  Status is: Observation The patient will require care spanning > 2 midnights and should be moved to inpatient because: Severe pain limiting mobility    Medications: Scheduled:  apixaban  2.5 mg Oral BID   aspirin  81 mg Oral Daily   docusate sodium  100 mg Oral BID   ketorolac  7.5 mg Intravenous Q6H   senna  2 tablet Oral QHS   Continuous: GNF:AOZHYQMVHQION **OR** acetaminophen, bisacodyl, HYDROmorphone (DILAUDID) injection, methocarbamol (ROBAXIN) injection, ondansetron **OR** ondansetron (ZOFRAN) IV, oxyCODONE, polyethylene glycol  Antibiotics: Anti-infectives (From admission, onward)    None       Objective:  Vital Signs  Vitals:   04/03/23 2136 04/04/23 0158 04/04/23 0558 04/04/23 0948  BP: 112/70 108/64 (!) 102/59 111/62  Pulse: 90 93 90 83  Resp: 18 18 18 14   Temp: 99 F (37.2 C) 98.5 F (36.9 C) 98.3 F (36.8 C) 98.2 F (36.8 C)  TempSrc:      SpO2: 100% 100% 100% 99%  Weight:      Height:       No intake or output data in the 24 hours ending 04/04/23 1027 Filed Weights   04/03/23 1118  Weight: 72.6 kg    General appearance: Awake alert.  In no distress Resp: Clear to auscultation bilaterally.  Normal effort Cardio: S1-S2 is normal regular.  No S3-S4.  No rubs murmurs or bruit GI: Abdomen is soft.  Nontender nondistended.  Bowel sounds are present normal.  No masses organomegaly  Extremities: Swelling of the right knee is noted without any erythema or warmth.  Decreased range of motion.   Lab Results:  Data Reviewed: I have personally reviewed following labs and reports of the imaging studies  CBC: Recent Labs  Lab 03/30/23 0820 04/03/23 1205 04/04/23 0603  WBC 3.5* 5.6 5.8  NEUTROABS  --  3.5  --   HGB 13.5 7.2* 9.5*  HCT 41.4 22.3* 28.8*  MCV 97.2 100.0 97.6  PLT 224 132* 164    Basic Metabolic Panel: Recent Labs  Lab 03/30/23 0820 04/03/23 1205 04/04/23 0313  NA  138 139 136  K 3.8 3.7 3.7  CL 105 105 98  CO2 26 27 32  GLUCOSE 101* 87 117*  BUN 17 17 15   CREATININE 0.58 0.63 0.70  CALCIUM 9.3 7.7* 8.3*    GFR: Estimated Creatinine Clearance: 74.9 mL/min (by C-G formula based on SCr of 0.7 mg/dL).  Liver Function Tests: Recent Labs  Lab 04/04/23 0603  AST 18  ALT 14  ALKPHOS 46  BILITOT 0.9  PROT 6.0*  ALBUMIN 2.9*    Anemia Panel: Recent Labs    04/03/23 1322 04/03/23 1404  VITAMINB12  --  493  FOLATE 19.3  --   FERRITIN  --  105  TIBC  --  290  IRON  --  21*  RETICCTPCT  --  2.0    Recent Results (from the past 240 hour(s))  Surgical pcr screen     Status: Abnormal   Collection Time: 03/30/23  8:15 AM   Specimen: Nasal Mucosa; Nasal Swab  Result Value Ref Range Status   MRSA, PCR NEGATIVE NEGATIVE Final   Staphylococcus aureus POSITIVE (A) NEGATIVE Final    Comment: (NOTE) The Xpert SA Assay (FDA approved for NASAL specimens in patients 47 years of age and older), is one component of a comprehensive surveillance program. It is not intended to diagnose infection nor to guide or monitor treatment. Performed at [facility], [address] [place], [place] [address]       Radiology Studies: VAS [place] LOWER EXTREMITY VENOUS (DVT) (7a-7p)  Result Date: 04/04/2023  Lower Venous DVT Study Patient Name:  Jacqueline Willis  Date of Exam:   04/03/2023 Medical Rec #: [id number]        Accession #:    [id number] Date of Birth: 11-07-1965        Patient Gender: F Patient Age:   57 years Exam Location:  [facility] Procedure:      VAS [place] LOWER EXTREMITY VENOUS (DVT) Referring Phys: Evlyn Kanner --------------------------------------------------------------------------------  Indications: Pain.  Risk Factors: Surgery. Limitations: Poor ultrasound/tissue interface. Comparison Study: No prior studies. Performing Technologist: Chanda Busing RVT  Examination Guidelines: A complete evaluation includes B-mode  imaging, spectral Doppler, color Doppler, and power Doppler as needed of all accessible portions of each vessel. Bilateral testing is considered an integral part of a complete examination. Limited examinations for reoccurring indications may be performed as noted. The reflux portion of the exam is performed with the patient in reverse Trendelenburg.  +---------+---------------+---------+-----------+----------+--------------+ RIGHT    CompressibilityPhasicitySpontaneityPropertiesThrombus Aging +---------+---------------+---------+-----------+----------+--------------+ CFV      Full           Yes      Yes                                 +---------+---------------+---------+-----------+----------+--------------+ SFJ      Full                                                        +---------+---------------+---------+-----------+----------+--------------+  FV Prox  Full                                                        +---------+---------------+---------+-----------+----------+--------------+ FV Mid   Full                                                        +---------+---------------+---------+-----------+----------+--------------+ FV Distal               Yes      Yes                                 +---------+---------------+---------+-----------+----------+--------------+ PFV      Full                                                        +---------+---------------+---------+-----------+----------+--------------+ POP      Full           Yes      Yes                                 +---------+---------------+---------+-----------+----------+--------------+ PTV      Full                                                        +---------+---------------+---------+-----------+----------+--------------+ PERO     Full                                                        +---------+---------------+---------+-----------+----------+--------------+  Gastroc  Full                                                        +---------+---------------+---------+-----------+----------+--------------+   +----+---------------+---------+-----------+----------+--------------+ LEFTCompressibilityPhasicitySpontaneityPropertiesThrombus Aging +----+---------------+---------+-----------+----------+--------------+ CFV Full           Yes      Yes                                 +----+---------------+---------+-----------+----------+--------------+    Summary: RIGHT: - There is no evidence of deep vein thrombosis in the lower extremity.  - No cystic structure found in the popliteal fossa.  LEFT: - No evidence of common femoral vein obstruction.   *See table(s) above for measurements and observations. Electronically signed by Carolynn Sayers on 04/04/2023 at 8:58:59 AM.  Final    DG Knee Complete 4 Views Right  Result Date: 04/03/2023 CLINICAL DATA:  Postop right knee pain. EXAM: RIGHT KNEE - COMPLETE 4+ VIEW COMPARISON:  04/01/2023. FINDINGS: Knee prosthetic components appear well seated and aligned and unchanged. No acute fracture. No bone lesion. Possible small effusion. Joint space air noted on the prior study has mostly resorbed. Soft tissue edema is similar. IMPRESSION: 1. No fracture or acute finding. 2. Knee prosthetic components appear well seated and aligned. Electronically Signed   By: Amie Portland M.D.   On: 04/03/2023 12:22       LOS: 0 days   Kooper Chriswell Rito Ehrlich  [facility] Pager on [email or web address]  04/04/2023, 10:27 AM

## 2023-04-04 NOTE — Progress Notes (Signed)
Patient ID: Jacqueline Willis, female   DOB: [birth year], 57 y.o.   MRN: [id number] Subjective:       Patient reports pain as moderate to severe (nauseating)  Objective:   VITALS:   Vitals:   04/04/23 0558 04/04/23 0948  BP: (!) 102/59 111/62  Pulse: 90 83  Resp: 18 14  Temp: 98.3 F (36.8 C) 98.2 F (36.8 C)  SpO2: 100% 99%    Neurovascular intact Incision: dressing C/D/I - right knee wound  LABS Recent Labs    04/03/23 1205 04/04/23 0603  HGB 7.2* 9.5*  HCT 22.3* 28.8*  WBC 5.6 5.8  PLT 132* 164    Recent Labs    04/03/23 1205 04/04/23 0313  NA 139 136  K 3.7 3.7  BUN 17 15  CREATININE 0.63 0.70  GLUCOSE 87 117*    No results for input(s): "LABPT", "INR" in the last 72 hours.   Assessment/Plan:      Advance diet Up with therapy Continue to work on pain control, I will add some doses of Toradol 7.5 mg to see if this will help ease her pain  I acknowledge that she is on Eliquis due to history of DVT but I feel that this dose and duration will be safe and have significant benefit to her current state  Hgb on the rise

## 2023-04-04 NOTE — Progress Notes (Signed)
Physical Therapy Treatment Patient Details Name: Jacqueline Willis MRN: [id number] DOB: 07-05-1965 Today's Date: 04/04/2023   History of Present Illness Pt admitted from home 2* uncontrolled pain following R TKR 04/01/23 with dc home same day.    PT Comments  Pt very cooperative and progressing steadily with mobility and with reported improvement in pain control.  Pt hopeful for dc home tomorrow.    If plan is discharge home, recommend the following: A little help with walking and/or transfers;Assist for transportation;Help with stairs or ramp for entrance;Assistance with cooking/housework   Can travel by private vehicle        Equipment Recommendations  Rolling walker (2 wheels)    Recommendations for Other Services       Precautions / Restrictions Precautions Precautions: Knee;Fall Restrictions Weight Bearing Restrictions: No Other Position/Activity Restrictions: WBAT     Mobility  Bed Mobility Overal bed mobility: Needs Assistance Bed Mobility: Sit to Supine     Supine to sit: Contact guard Sit to supine: Contact guard assist   General bed mobility comments: Increased time with CGA for safe management of R LE    Transfers Overall transfer level: Needs assistance Equipment used: Rolling walker (2 wheels) Transfers: Sit to/from Stand Sit to Stand: Contact guard assist           General transfer comment: cues for LE management and use of UEs to self assist    Ambulation/Gait Ambulation/Gait assistance: Contact guard assist Gait Distance (Feet): 69 Feet Assistive device: Rolling walker (2 wheels) Gait Pattern/deviations: Step-to pattern, Decreased step length - right, Decreased step length - left, Shuffle, Trunk flexed Gait velocity: decr     General Gait Details: cues for sequence, posture and position from RW; distance ltd by pain   Stairs             Wheelchair Mobility     Tilt Bed    Modified Rankin (Stroke Patients Only)        Balance Overall balance assessment: Mild deficits observed, not formally tested                                          Cognition Arousal: Alert Behavior During Therapy: WFL for tasks assessed/performed Overall Cognitive Status: Within Functional Limits for tasks assessed                                 General Comments: much more alert        Exercises Total Joint Exercises Ankle Circles/Pumps: AROM, Both, 15 reps, Supine Quad Sets: AROM, Both, 10 reps, Supine Heel Slides: AAROM, Right, Supine, 15 reps Straight Leg Raises: AAROM, Right, Supine, 15 reps    General Comments        Pertinent Vitals/Pain Pain Assessment Pain Assessment: 0-10 Pain Score: 5  Pain Location: R knee Pain Descriptors / Indicators: Aching, Sore Pain Intervention(s): Limited activity within patient's tolerance, Monitored during session, Premedicated before session, Ice applied    Home Living Family/patient expects to be discharged to:: Private residence Living Arrangements: Spouse/significant other Available Help at Discharge: Family Type of Home: House Home Access: Stairs to enter Entrance Stairs-Rails: None Entrance Stairs-Number of Steps: 3   Home Layout: One level Home Equipment: Agricultural consultant (2 wheels)      Prior Function  PT Goals (current goals can now be found in the care plan section) Acute Rehab PT Goals Patient Stated Goal: go home with improved pain control PT Goal Formulation: With patient/family Time For Goal Achievement: 04/15/23 Potential to Achieve Goals: Good Progress towards PT goals: Progressing toward goals    Frequency    7X/week      PT Plan      Co-evaluation              AM-PAC PT "6 Clicks" Mobility   Outcome Measure  Help needed turning from your back to your side while in a flat bed without using bedrails?: A Little Help needed moving from lying on your back to sitting on the side of a flat  bed without using bedrails?: A Little Help needed moving to and from a bed to a chair (including a wheelchair)?: A Little Help needed standing up from a chair using your arms (e.g., wheelchair or bedside chair)?: A Little Help needed to walk in hospital room?: A Little Help needed climbing 3-5 steps with a railing? : A Lot 6 Click Score: 17    End of Session Equipment Utilized During Treatment: Gait belt Activity Tolerance: Patient tolerated treatment well Patient left: in bed;with call bell/phone within reach;with bed alarm set Nurse Communication: Mobility status PT Visit Diagnosis: Unsteadiness on feet (R26.81);Muscle weakness (generalized) (M62.81);Difficulty in walking, not elsewhere classified (R26.2);Pain Pain - Right/Left: Right Pain - part of body: Knee     Time: 1610-9604 PT Time Calculation (min) (ACUTE ONLY): 24 min  Charges:    $Gait Training: 8-22 mins $Therapeutic Exercise: 8-22 mins PT General Charges $$ ACUTE PT VISIT: 1 Visit                     Jacqueline Willis PT Acute Rehabilitation Services Pager [phone] Office [phone]    Jacqueline Willis 04/04/2023, 3:22 PM

## 2023-04-05 DIAGNOSIS — M25561 Pain in right knee: Secondary | ICD-10-CM | POA: Diagnosis not present

## 2023-04-05 DIAGNOSIS — D649 Anemia, unspecified: Secondary | ICD-10-CM | POA: Diagnosis not present

## 2023-04-05 DIAGNOSIS — K5900 Constipation, unspecified: Secondary | ICD-10-CM | POA: Diagnosis not present

## 2023-04-05 LAB — BASIC METABOLIC PANEL
Anion gap: 6 (ref 5–15)
BUN: 16 mg/dL (ref 6–20)
CO2: 28 mmol/L (ref 22–32)
Calcium: 8.1 mg/dL — ABNORMAL LOW (ref 8.9–10.3)
Chloride: 101 mmol/L (ref 98–111)
Creatinine, Ser: 0.64 mg/dL (ref 0.44–1.00)
GFR, Estimated: >60 mL/min (ref >60)
Glucose, Bld: 132 mg/dL — ABNORMAL HIGH (ref 70–99)
Potassium: 3.6 mmol/L (ref 3.5–5.1)
Sodium: 135 mmol/L (ref 135–145)

## 2023-04-05 LAB — CBC
HCT: 30.1 % — ABNORMAL LOW (ref 36.0–46.0)
Hemoglobin: 9.8 g/dL — ABNORMAL LOW (ref 12.0–15.0)
MCH: 31.8 pg (ref 26.0–34.0)
MCHC: 32.6 g/dL (ref 30.0–36.0)
MCV: 97.7 fL (ref 80.0–100.0)
Platelets: 193 10*3/uL (ref 150–400)
RBC: 3.08 MIL/uL — ABNORMAL LOW (ref 3.87–5.11)
RDW: 12 % (ref 11.5–15.5)
WBC: 5.5 10*3/uL (ref 4.0–10.5)
nRBC: 0 % (ref 0.0–0.2)

## 2023-04-05 MED ORDER — DIPHENHYDRAMINE-ZINC ACETATE 2-0.1 % EX CREA
TOPICAL_CREAM | Freq: Two times a day (BID) | CUTANEOUS | Status: DC | PRN
Start: 2023-04-05 — End: 2023-04-07
  Filled 2023-04-05: qty 28

## 2023-04-05 MED ORDER — POTASSIUM CHLORIDE CRYS ER 20 MEQ PO TBCR
40.0000 meq | EXTENDED_RELEASE_TABLET | Freq: Once | ORAL | Status: AC
  Administered 2023-04-05: 40 meq via ORAL
  Filled 2023-04-05: qty 2

## 2023-04-05 MED ORDER — BISACODYL 10 MG RE SUPP
10.0000 mg | Freq: Every day | RECTAL | Status: DC | PRN
  Administered 2023-04-05: 10 mg via RECTAL
  Filled 2023-04-05: qty 1

## 2023-04-05 MED ORDER — SENNOSIDES-DOCUSATE SODIUM 8.6-50 MG PO TABS
2.0000 | ORAL_TABLET | Freq: Two times a day (BID) | ORAL | Status: DC
  Administered 2023-04-05 – 2023-04-07 (×5): 2 via ORAL
  Filled 2023-04-05 (×5): qty 2

## 2023-04-05 MED ORDER — FLEET ENEMA RE ENEM: 1.0000 | ENEMA | Freq: Every day | RECTAL | Status: DC | PRN

## 2023-04-05 MED ORDER — OXYCODONE-ACETAMINOPHEN 5-325 MG PO TABS
1.0000 | ORAL_TABLET | Freq: Three times a day (TID) | ORAL | Status: DC
  Administered 2023-04-05 – 2023-04-07 (×7): 1 via ORAL
  Filled 2023-04-05 (×7): qty 1

## 2023-04-05 MED ORDER — OXYCODONE HCL 5 MG PO TABS
5.0000 mg | ORAL_TABLET | ORAL | Status: DC | PRN
Start: 2023-04-05 — End: 2023-04-07
  Administered 2023-04-05 – 2023-04-07 (×7): 10 mg via ORAL
  Filled 2023-04-05: qty 2
  Filled 2023-04-05: qty 1
  Filled 2023-04-05 (×6): qty 2

## 2023-04-05 MED ORDER — POLYETHYLENE GLYCOL 3350 17 G PO PACK
17.0000 g | PACK | Freq: Two times a day (BID) | ORAL | Status: DC
  Administered 2023-04-05 – 2023-04-07 (×5): 17 g via ORAL
  Filled 2023-04-05 (×5): qty 1

## 2023-04-05 NOTE — Progress Notes (Signed)
[facility] PROGRESS NOTE   [name] ZOX:[id number] DOB: 07-May-1965 DOA: 04/03/2023  PCP: Crist Fat, MD  Brief History:  57 y.o. 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  Imaging studies did not show any fractures.  Orthopedics was consulted.  She was also noted to have new anemia.  She was hospitalized for further management.  Consultants: Orthopedics  Procedures: None yet    Subjective/Interval History: Patient mentions that pain is 6-7 out of 10 in intensity.  Better than yesterday.  Was able to ambulate with physical therapy.  Has not had a bowel movement yet.  No other complaints offered.      Assessment/Plan:  Severe pain right knee She is status post total knee arthroplasty of the right knee on 12/5.  Emon with severe pain.  Imaging studies did not show any acute findings.  No DVT on ultrasound. Seen by orthopedics this morning.  She was even a few doses of Toradol with improvement in her symptoms.  Since she is on anticoagulation would like to avoid NSAIDs.  Will start her on scheduled Percocet for 3 doses. Seen by PT and OT.  Home health is recommended.    Constipation Likely secondary to recent surgery, pain, pain medications.  Has not had a bowel movement in several days Aggressive bowel regimen has been prescribed.  Discussed with nursing staff.  Can be given enema this afternoon if she does not have a BM by then.  Normocytic anemia Patient's hemoglobin was normal on 12/3 when it was 13.5.   Noted to be 7.2 at the time of admission.  Platelet counts were also noted to be low yesterday.   Hemoglobin was noted to be 9.5 on 12/8.  This went up without any transfusion.  Initial sample might have been hemodiluted.  No bleeding episodes noted. Hemoglobin stable over the last 24 hours.   Anemia panel was done which  showed ferritin of 105, iron 21, TIBC 290, percent saturation 7.  Folic acid 19.3.  Vitamin B12 493. LFTs are unremarkable. Platelet counts have normalized. Patient was reassured.  History of PE and DVT This was several years ago.  Patient remains on Eliquis.  No DVT identified on Doppler studies done during this hospitalization.   DVT Prophylaxis: On Eliquis and aspirin Code Status: Full code Family Communication: Discussed with patient Disposition Plan: Hopefully return home when pain is better controlled.  Anticipate discharge tomorrow.     Medications: Scheduled:  apixaban  2.5 mg Oral BID   aspirin  81 mg Oral Daily   oxyCODONE-acetaminophen  1 tablet Oral TID   polyethylene glycol  17 g Oral BID   senna-docusate  2 tablet Oral BID   Continuous: WJX:BJYNWGNFAOZHY **OR** acetaminophen, bisacodyl, bisacodyl, HYDROmorphone (DILAUDID) injection, methocarbamol (ROBAXIN) injection, ondansetron **OR** ondansetron (ZOFRAN) IV, oxyCODONE, sodium phosphate  Antibiotics: Anti-infectives (From admission, onward)    None       Objective:  Vital Signs  Vitals:   04/04/23 0948 04/04/23 1408 04/04/23 2219 04/05/23 0520  BP: 111/62 (!) 105/56 110/61 119/60  Pulse: 83 97 95 82  Resp: 14 17 18 18   Temp: 98.2 F (36.8 C) 98.3 F (36.8 C) 98.6 F (37 C) 97.9 F (36.6 C)  TempSrc:   Oral   SpO2: 99% 100% 96% 98%  Weight:      Height:  Intake/Output Summary (Last 24 hours) at 04/05/2023 1016 Last data filed at 04/05/2023 0836 Gross per 24 hour  Intake 600 ml  Output --  Net 600 ml   Filed Weights   04/03/23 1118  Weight: 72.6 kg    General appearance: Awake alert.  In no distress Resp: Clear to auscultation bilaterally.  Normal effort Cardio: S1-S2 is normal regular.  No S3-S4.  No rubs murmurs or bruit GI: Abdomen is soft.  Nontender nondistended.  Bowel sounds are present normal.  No masses organomegaly Extremities: Swelling of the right knee is noted  without any erythema or warmth.   Lab Results:  Data Reviewed: I have personally reviewed following labs and reports of the imaging studies  CBC: Recent Labs  Lab 03/30/23 0820 04/03/23 1205 04/04/23 0603 04/04/23 1424 04/05/23 0340  WBC 3.5* 5.6 5.8 7.1 5.5  NEUTROABS  --  3.5  --   --   --   HGB 13.5 7.2* 9.5* 9.6* 9.8*  HCT 41.4 22.3* 28.8* 29.6* 30.1*  MCV 97.2 100.0 97.6 96.7 97.7  PLT 224 132* 164 182 193    Basic Metabolic Panel: Recent Labs  Lab 03/30/23 0820 04/03/23 1205 04/04/23 0313 04/05/23 0340  NA 138 139 136 135  K 3.8 3.7 3.7 3.6  CL 105 105 98 101  CO2 26 27 32 28  GLUCOSE 101* 87 117* 132*  BUN 17 17 15 16   CREATININE 0.58 0.63 0.70 0.64  CALCIUM 9.3 7.7* 8.3* 8.1*    GFR: Estimated Creatinine Clearance: 74.9 mL/min (by C-G formula based on SCr of 0.64 mg/dL).  Liver Function Tests: Recent Labs  Lab 04/04/23 0603  AST 18  ALT 14  ALKPHOS 46  BILITOT 0.9  PROT 6.0*  ALBUMIN 2.9*    Anemia Panel: Recent Labs    04/03/23 1322 04/03/23 1404  VITAMINB12  --  493  FOLATE 19.3  --   FERRITIN  --  105  TIBC  --  290  IRON  --  21*  RETICCTPCT  --  2.0    Recent Results (from the past 240 hour(s))  Surgical pcr screen     Status: Abnormal   Collection Time: 03/30/23  8:15 AM   Specimen: Nasal Mucosa; Nasal Swab  Result Value Ref Range Status   MRSA, PCR NEGATIVE NEGATIVE Final   Staphylococcus aureus POSITIVE (A) NEGATIVE Final    Comment: (NOTE) The Xpert SA Assay (FDA approved for NASAL specimens in patients 2 years of age and older), is one component of a comprehensive surveillance program. It is not intended to diagnose infection nor to guide or monitor treatment. Performed at [facility], [address], [place], [place] [address]       Radiology Studies: VAS [place] LOWER EXTREMITY VENOUS (DVT) (7a-7p)  Result Date: 04/04/2023  Lower Venous DVT Study Patient Name:  Jacqueline Willis  Date of Exam:    04/03/2023 Medical Rec #: [id number]        Accession #:    [id number] Date of Birth: 21-Oct-1965        Patient Gender: F Patient Age:   80 years Exam Location:  [facility] Procedure:      VAS [place] LOWER EXTREMITY VENOUS (DVT) Referring Phys: Evlyn Kanner --------------------------------------------------------------------------------  Indications: Pain.  Risk Factors: Surgery. Limitations: Poor ultrasound/tissue interface. Comparison Study: No prior studies. Performing Technologist: Chanda Busing RVT  Examination Guidelines: A complete evaluation includes B-mode imaging, spectral Doppler, color Doppler, and power Doppler as needed  of all accessible portions of each vessel. Bilateral testing is considered an integral part of a complete examination. Limited examinations for reoccurring indications may be performed as noted. The reflux portion of the exam is performed with the patient in reverse Trendelenburg.  +---------+---------------+---------+-----------+----------+--------------+ RIGHT    CompressibilityPhasicitySpontaneityPropertiesThrombus Aging +---------+---------------+---------+-----------+----------+--------------+ CFV      Full           Yes      Yes                                 +---------+---------------+---------+-----------+----------+--------------+ SFJ      Full                                                        +---------+---------------+---------+-----------+----------+--------------+ FV Prox  Full                                                        +---------+---------------+---------+-----------+----------+--------------+ FV Mid   Full                                                        +---------+---------------+---------+-----------+----------+--------------+ FV Distal               Yes      Yes                                 +---------+---------------+---------+-----------+----------+--------------+ PFV      Full                                                         +---------+---------------+---------+-----------+----------+--------------+ POP      Full           Yes      Yes                                 +---------+---------------+---------+-----------+----------+--------------+ PTV      Full                                                        +---------+---------------+---------+-----------+----------+--------------+ PERO     Full                                                        +---------+---------------+---------+-----------+----------+--------------+ Gastroc  Full                                                        +---------+---------------+---------+-----------+----------+--------------+   +----+---------------+---------+-----------+----------+--------------+  LEFTCompressibilityPhasicitySpontaneityPropertiesThrombus Aging +----+---------------+---------+-----------+----------+--------------+ CFV Full           Yes      Yes                                 +----+---------------+---------+-----------+----------+--------------+    Summary: RIGHT: - There is no evidence of deep vein thrombosis in the lower extremity.  - No cystic structure found in the popliteal fossa.  LEFT: - No evidence of common femoral vein obstruction.   *See table(s) above for measurements and observations. Electronically signed by Carolynn Sayers on 04/04/2023 at 8:58:59 AM.    Final    DG Knee Complete 4 Views Right  Result Date: 04/03/2023 CLINICAL DATA:  Postop right knee pain. EXAM: RIGHT KNEE - COMPLETE 4+ VIEW COMPARISON:  04/01/2023. FINDINGS: Knee prosthetic components appear well seated and aligned and unchanged. No acute fracture. No bone lesion. Possible small effusion. Joint space air noted on the prior study has mostly resorbed. Soft tissue edema is similar. IMPRESSION: 1. No fracture or acute finding. 2. Knee prosthetic components appear well seated and aligned. Electronically Signed    By: Amie Portland M.D.   On: 04/03/2023 12:22       LOS: 1 day   Osvaldo Shipper  [facility] Pager on [email or web address]  04/05/2023, 10:16 AM

## 2023-04-05 NOTE — Progress Notes (Signed)
Physical Therapy Treatment Patient Details Name: Jacqueline Willis MRN: [id number] DOB: 14-Apr-1966 Today's Date: 04/05/2023   History of Present Illness Pt admitted from home 2* uncontrolled pain following R TKR 04/01/23 with dc home same day.  ED POD # 2 due to PAIN.     PT Comments  POD # 4 am session Pt OOB via OT.  Per OT, pt wants to see if she can get [facility] PT vs OP as Spouse works and children are at school. Assisted with amb in hallway a short distance as focus of session was stair training.  Pt was able to recall proper tech up backward with walker.  Then returned to room to perform some TE's following HEP handout.  Instructed on proper tech, freq as well as use of ICE.   Addressed all mobility questions, discussed appropriate activity, educated on use of ICE.  Pt ready for D/C to home.    If plan is discharge home, recommend the following: A little help with walking and/or transfers;Assist for transportation;Help with stairs or ramp for entrance;Assistance with cooking/housework   Can travel by private vehicle        Equipment Recommendations  None recommended by PT (pt HAS a walker at home)    Recommendations for Other Services       Precautions / Restrictions Precautions Precautions: Knee;Fall Precaution Comments: no pillow under knee Restrictions Weight Bearing Restrictions: No Other Position/Activity Restrictions: WBAT     Mobility  Bed Mobility               General bed mobility comments: pt was OOB in recliner.    Transfers Overall transfer level: Needs assistance Equipment used: Rolling walker (2 wheels) Transfers: Sit to/from Stand, Bed to chair/wheelchair/BSC Sit to Stand: Supervision   Step pivot transfers: Independent       General transfer comment: good use of hands to steady self.  Goof safet6y cognition.    Ambulation/Gait Ambulation/Gait assistance: Supervision Gait Distance (Feet): 15 Feet Assistive device: Rolling walker (2  wheels) Gait Pattern/deviations: Step-to pattern, Decreased step length - right, Decreased step length - left, Shuffle, Trunk flexed Gait velocity: decr     General Gait Details: decreased amb distance due to focus stair training.  Pt reports pain is "better".   Stairs Stairs: Yes Stairs assistance: Min assist Stair Management: No rails, Backwards, With walker Number of Stairs: 2 General stair comments: pt was able to recall proper tech up backward with walker from performing yesterday with Spouse.   Wheelchair Mobility     Tilt Bed    Modified Rankin (Stroke Patients Only)       Balance                                            Cognition Arousal: Alert   Overall Cognitive Status: Within Functional Limits for tasks assessed                                 General Comments: Pt anxious about pain and has concerns about "who" will help he at home.  Pt has a Spouse who works and children who are at school.        Exercises  Total Knee Replacement TE's following HEP handout 10 reps B LE ankle pumps 05 reps towel squeezes 05 reps knee presses  05 reps heel slides  05 reps SAQ's 05 reps SLR's 05 reps ABD Educated on use of gait belt to assist with TE's Followed by ICE     General Comments General comments (skin integrity, edema, etc.): Pt appears to have pain under better control today. Mobilizing with supervision and most assist needed for LE dressing.      Pertinent Vitals/Pain Pain Assessment Pain Assessment: Faces Faces Pain Scale: Hurts little more Pain Location: R knee Pain Descriptors / Indicators: Aching, Sore Pain Intervention(s): Monitored during session, Premedicated before session, Repositioned, Ice applied    Home Living Family/patient expects to be discharged to:: Private residence Living Arrangements: Spouse/significant other Available Help at Discharge: Family;Available PRN/intermittently Type of Home:  House Home Access: Stairs to enter Entrance Stairs-Rails: None Entrance Stairs-Number of Steps: 3   Home Layout: One level Home Equipment: Agricultural consultant (2 wheels) Additional Comments: Pt would benefit from 3:1 and tub bench. Pictures sent to pt for husband to look into purchasing since not covered by insurance.    Prior Function            PT Goals (current goals can now be found in the care plan section) Progress towards PT goals: Progressing toward goals    Frequency    7X/week      PT Plan      Co-evaluation              AM-PAC PT "6 Clicks" Mobility   Outcome Measure  Help needed turning from your back to your side while in a flat bed without using bedrails?: A Little Help needed moving from lying on your back to sitting on the side of a flat bed without using bedrails?: A Little Help needed moving to and from a bed to a chair (including a wheelchair)?: A Little Help needed standing up from a chair using your arms (e.g., wheelchair or bedside chair)?: A Little Help needed to walk in hospital room?: A Little Help needed climbing 3-5 steps with a railing? : A Little 6 Click Score: 18    End of Session Equipment Utilized During Treatment: Gait belt Activity Tolerance: Patient tolerated treatment well Patient left: in bed;with call bell/phone within reach;with bed alarm set Nurse Communication: Mobility status PT Visit Diagnosis: Unsteadiness on feet (R26.81);Muscle weakness (generalized) (M62.81);Difficulty in walking, not elsewhere classified (R26.2);Pain Pain - Right/Left: Right Pain - part of body: Knee     Time: 1005-1030 PT Time Calculation (min) (ACUTE ONLY): 25 min  Charges:    $Gait Training: 8-22 mins $Therapeutic Exercise: 8-22 mins PT General Charges $$ ACUTE PT VISIT: 1 Visit                     Felecia Shelling  PTA Acute  Rehabilitation Services Office M-F          [phone]

## 2023-04-05 NOTE — TOC Transition Note (Signed)
Transition of Care [facility]) - CM/SW Discharge Note   Patient Details  Name: Jacqueline Willis MRN: [id number] Date of Birth: December 24, 1965  [facility]) CM/SW Contact:  Amada Jupiter, LCSW Phone Number: 04/05/2023, 3:05 PM   Clinical Narrative:     Spoke with pt and spouse this afternoon following their discussion with Clint Bolder, PA.  They are agreed to stick with plan for OPPT as planned prior to surgery.  Pt has needed RW in the home already.  Pt may choose to purchase bsc on her own IF she feels she needs this.  No further TOC needs.  Final next level of care: OP Rehab Barriers to Discharge: No Barriers Identified   Patient Goals and CMS Choice      Discharge Placement                         Discharge Plan and Services Additional resources added to the After Visit Summary for                  DME Arranged: N/A DME Agency: NA                  Social Determinants of Health (SDOH) Interventions SDOH Screenings   Food Insecurity: Patient Declined (04/03/2023)  Housing: Low Risk  (04/03/2023)  Transportation Needs: No Transportation Needs (04/03/2023)  Utilities: Patient Declined (04/03/2023)  Tobacco Use: Low Risk  (04/03/2023)     Readmission Risk Interventions    04/05/2023    3:05 PM  Readmission Risk Prevention Plan  Post Dischage Appt Complete  Medication Screening Complete  Transportation Screening Complete

## 2023-04-05 NOTE — Progress Notes (Signed)
    Subjective:  Patient reports pain as mild to moderate.  Denies V/CP/SOB. She reports some nausea. She reports she has not had a bowel movement since before surgery. She has fleet enema ordered for today.  She reports that her pain is much better controlled with the oxycodone 10mg  every 4 hours and the tylenol. She denies any tingling or numbness in LE bilaterally.   Objective:   VITALS:   Vitals:   04/04/23 0948 04/04/23 1408 04/04/23 2219 04/05/23 0520  BP: 111/62 (!) 105/56 110/61 119/60  Pulse: 83 97 95 82  Resp: 14 17 18 18   Temp: 98.2 F (36.8 C) 98.3 F (36.8 C) 98.6 F (37 C) 97.9 F (36.6 C)  TempSrc:   Oral   SpO2: 99% 100% 96% 98%  Weight:      Height:        NAD Neurologically intact ABD soft Neurovascular intact Sensation intact distally Intact pulses distally Dorsiflexion/Plantar flexion intact Incision: dressing C/D/I No cellulitis present Compartment soft Mild swelling noted.   Lab Results  Component Value Date   WBC 5.5 04/05/2023   HGB 9.8 (L) 04/05/2023   HCT 30.1 (L) 04/05/2023   MCV 97.7 04/05/2023   PLT 193 04/05/2023   BMET    Component Value Date/Time   NA 135 04/05/2023 0340   NA 144 01/25/2023 0000   K 3.6 04/05/2023 0340   CL 101 04/05/2023 0340   CO2 28 04/05/2023 0340   GLUCOSE 132 (H) 04/05/2023 0340   BUN 16 04/05/2023 0340   BUN 16 01/25/2023 0000   CREATININE 0.64 04/05/2023 0340   CALCIUM 8.1 (L) 04/05/2023 0340   EGFR 101 01/25/2023 0000   GFRNONAA >60 04/05/2023 0340     Assessment/Plan:     Principal Problem:   Right knee pain Active Problems:   History of DVT (deep vein thrombosis)   History of pulmonary embolus (PE)   Acute postoperative anemia due to expected blood loss   Anemia  ABLA hemoglobin 9.8. Asymptomatic.   WBAT with walker DVT ppx:  Eliquis , SCDs, TEDS PO pain control PT/OTShe ambulated 34 and 69 feet with PT yesterday. Pain much better controlled today.  Dispo:  - Patient under  care of the medical team disposition per their recommendation.  - Discussed with patient and social worker about continued plan for OPPT that patient and husband are in agreement with.  - She has family that can be home with her and help with transportation with PT appointments.  - Continue pain control.  - Patient to follow-up for normal scheduled PO visit in office. She already has OPPT scheduled.    Clois Dupes, PA-C 04/05/2023, 1:06 PM   [facility]  [facility] [address], [address], [place], [place] [address] Phone: [phone] [email or web address] Facebook  [employer]

## 2023-04-05 NOTE — Evaluation (Signed)
Occupational Therapy Evaluation Patient Details Name: Jacqueline Willis MRN: [id number] DOB: 11-06-1965 Today's Date: 04/05/2023   History of Present Illness Pt admitted from home 2* uncontrolled pain following R TKR 04/01/23 with dc home same day.   Clinical Impression   Pt admitted with the above diagnosis and has the deficits outlined below. Pt is very concerned about being home alone since husband at work and sons work as well.  Pt wanting to go to rehab but did explain to pt that she is functioning at supervision level for adl mobility and assist only for LE dressing and tub transfers.  Pt will be more independent at home with 3:1 over commode and tub bench.  Pictures of both have been given to pt. If husband not able to take pt to OPPT and pain is too much to handle getting there, feel HHOT would be appropriate but would prefer OP if pt is able. Pt stated she feels she may need someone with her all the time for a few days. Talked to case management and they will provide private duty info to this pt.  Feel pt will be mobilizing and doing adls without assist in just a few days and did encourage her to see if anyone at all could stay with her for her comfort for the next 2-3 days after d/c. Case management aware of above issues and will talk with pt further. Will continue seeing acutely.      If plan is discharge home, recommend the following: A little help with bathing/dressing/bathroom;Assistance with cooking/housework;Assist for transportation    Functional Status Assessment  Patient has had a recent decline in their functional status and demonstrates the ability to make significant improvements in function in a reasonable and predictable amount of time.  Equipment Recommendations  BSC/3in1;Tub/shower bench    Recommendations for Other Services       Precautions / Restrictions Precautions Precautions: Knee;Fall Precaution Comments: no pillow under knee Restrictions Weight Bearing  Restrictions: No Other Position/Activity Restrictions: WBAT      Mobility Bed Mobility Overal bed mobility: Needs Assistance Bed Mobility: Supine to Sit     Supine to sit: Supervision     General bed mobility comments: No physical assist needed.    Transfers Overall transfer level: Needs assistance Equipment used: Rolling walker (2 wheels) Transfers: Sit to/from Stand, Bed to chair/wheelchair/BSC Sit to Stand: Supervision     Step pivot transfers: Supervision     General transfer comment: Pt used rolling walker.      Balance Overall balance assessment: Mild deficits observed, not formally tested   Sitting balance-Leahy Scale: Normal     Standing balance support: During functional activity, Bilateral upper extremity supported, Reliant on assistive device for balance Standing balance-Leahy Scale: Poor                             ADL either performed or assessed with clinical judgement   ADL Overall ADL's : Needs assistance/impaired Eating/Feeding: Independent   Grooming: Wash/dry hands;Wash/dry face;Oral care;Independent;Standing   Upper Body Bathing: Independent;Sitting   Lower Body Bathing: Minimal assistance;Sit to/from stand;Cueing for compensatory techniques Lower Body Bathing Details (indicate cue type and reason): min assist to reach feet. Upper Body Dressing : Set up;Sitting   Lower Body Dressing: Moderate assistance;Sit to/from stand;Cueing for compensatory techniques   Toilet Transfer: Comptroller;Ambulation;Rolling walker (2 wheels)   Toileting- Clothing Manipulation and Hygiene: Supervision/safety;Sit to/from stand       Functional  mobility during ADLs: Supervision/safety;Rolling walker (2 wheels) General ADL Comments: Pt overall only requires significant assist with LE dressing.  Pt might benefit from reacher and sock aid but would not need them for long. Discussed with pt. Declining for now.      Vision Baseline Vision/History: 0 No visual deficits Ability to See in Adequate Light: 0 Adequate Patient Visual Report: No change from baseline Vision Assessment?: No apparent visual deficits     Perception Perception: Within Functional Limits       Praxis Praxis: WFL       Pertinent Vitals/Pain Pain Assessment Pain Assessment: Faces Faces Pain Scale: Hurts even more Pain Location: R knee Pain Descriptors / Indicators: Aching, Sore Pain Intervention(s): Monitored during session, Repositioned, Premedicated before session, Ice applied     Extremity/Trunk Assessment Upper Extremity Assessment Upper Extremity Assessment: Overall WFL for tasks assessed   Lower Extremity Assessment Lower Extremity Assessment: Defer to PT evaluation   Cervical / Trunk Assessment Cervical / Trunk Assessment: Normal   Communication Communication Communication: No apparent difficulties   Cognition Arousal: Alert Behavior During Therapy: WFL for tasks assessed/performed Overall Cognitive Status: Within Functional Limits for tasks assessed                                 General Comments: Pt anxious about pain     General Comments  Pt appears to have pain under better control today. Mobilizing with supervision and most assist needed for LE dressing.    Exercises     Shoulder Instructions      Home Living Family/patient expects to be discharged to:: Private residence Living Arrangements: Spouse/significant other Available Help at Discharge: Family;Available PRN/intermittently Type of Home: House Home Access: Stairs to enter [employer] of Steps: 3 Entrance Stairs-Rails: None Home Layout: One level     Bathroom Shower/Tub: Chief Strategy Officer: Standard     Home Equipment: Agricultural consultant (2 wheels)   Additional Comments: Pt would benefit from 3:1 and tub bench. Pictures sent to pt for husband to look into purchasing since not covered  by insurance.      Prior Functioning/Environment Prior Level of Function : Independent/Modified Independent               ADLs Comments: no assist PTA        OT Problem List: Impaired balance (sitting and/or standing);Pain      OT Treatment/Interventions: Self-care/ADL training;DME and/or AE instruction;Therapeutic activities;Balance training    OT Goals(Current goals can be found in the care plan section) Acute Rehab OT Goals Patient Stated Goal: to not have as much pain OT Goal Formulation: With patient Time For Goal Achievement: 04/19/23 Potential to Achieve Goals: Good ADL Goals Pt Will Perform Lower Body Bathing: with modified independence;with adaptive equipment;sit to/from stand Pt Will Perform Lower Body Dressing: with modified independence;with adaptive equipment;sit to/from stand Pt Will Perform Tub/Shower Transfer: Tub transfer;with supervision;ambulating;tub bench;rolling walker Additional ADL Goal #1: Pt will walk to bathroom and complete all toileting with mod I with 3:1 over commode.  OT Frequency: Min 1X/week    Co-evaluation              AM-PAC OT "6 Clicks" Daily Activity     Outcome Measure Help from another person eating meals?: None Help from another person taking care of personal grooming?: None Help from another person toileting, which includes using toliet, bedpan, or urinal?: A Little Help  from another person bathing (including washing, rinsing, drying)?: A Little Help from another person to put on and taking off regular upper body clothing?: A Little Help from another person to put on and taking off regular lower body clothing?: A Lot 6 Click Score: 19   End of Session Equipment Utilized During Treatment: Rolling walker (2 wheels) Nurse Communication: Mobility status;Other (comment) (need to talk to case management about d/c and possible HH therapy.)  Activity Tolerance: Patient tolerated treatment well Patient left: in chair;with call  bell/phone within reach;with chair alarm set  OT Visit Diagnosis: Unsteadiness on feet (R26.81)                Time: 4098-1191 OT Time Calculation (min): 35 min Charges:  OT General Charges $OT Visit: 1 Visit OT Evaluation $OT Eval Low Complexity: 1 Low OT Treatments $Self Care/Home Management : 8-22 mins  Hope Budds 04/05/2023, 10:14 AM

## 2023-04-06 ENCOUNTER — Other Ambulatory Visit (HOSPITAL_COMMUNITY): Payer: Self-pay

## 2023-04-06 DIAGNOSIS — D649 Anemia, unspecified: Secondary | ICD-10-CM | POA: Diagnosis not present

## 2023-04-06 DIAGNOSIS — M25561 Pain in right knee: Secondary | ICD-10-CM | POA: Diagnosis not present

## 2023-04-06 DIAGNOSIS — K5900 Constipation, unspecified: Secondary | ICD-10-CM | POA: Diagnosis not present

## 2023-04-06 LAB — CBC
HCT: 30.1 % — ABNORMAL LOW (ref 36.0–46.0)
Hemoglobin: 9.5 g/dL — ABNORMAL LOW (ref 12.0–15.0)
MCH: 31.3 pg (ref 26.0–34.0)
MCHC: 31.6 g/dL (ref 30.0–36.0)
MCV: 99 fL (ref 80.0–100.0)
Platelets: 239 10*3/uL (ref 150–400)
RBC: 3.04 MIL/uL — ABNORMAL LOW (ref 3.87–5.11)
RDW: 12.1 % (ref 11.5–15.5)
WBC: 4.9 10*3/uL (ref 4.0–10.5)
nRBC: 0 % (ref 0.0–0.2)

## 2023-04-06 MED ORDER — SENNOSIDES-DOCUSATE SODIUM 8.6-50 MG PO TABS
2.0000 | ORAL_TABLET | Freq: Two times a day (BID) | ORAL | 0 refills | Status: DC
  Filled 2023-04-06 (×3): qty 120, 30d supply, fill #0

## 2023-04-06 MED ORDER — POLYETHYLENE GLYCOL 3350 17 GM/SCOOP PO POWD
17.0000 g | Freq: Two times a day (BID) | ORAL | 0 refills | Status: AC
  Filled 2023-04-06: qty 952, 28d supply, fill #0

## 2023-04-06 MED ORDER — METHOCARBAMOL 500 MG PO TABS
500.0000 mg | ORAL_TABLET | Freq: Once | ORAL | Status: AC | PRN
  Administered 2023-04-06: 500 mg via ORAL
  Filled 2023-04-06: qty 1

## 2023-04-06 MED ORDER — OXYCODONE-ACETAMINOPHEN 5-325 MG PO TABS
1.0000 | ORAL_TABLET | Freq: Four times a day (QID) | ORAL | 0 refills | Status: DC | PRN
  Filled 2023-04-06: qty 24, 6d supply, fill #0
  Filled 2023-04-06: qty 6, 2d supply, fill #0

## 2023-04-06 MED ORDER — FLEET ENEMA RE ENEM
1.0000 | ENEMA | Freq: Once | RECTAL | Status: AC
  Administered 2023-04-06: 1 via RECTAL
  Filled 2023-04-06: qty 1

## 2023-04-06 NOTE — Progress Notes (Signed)
Occupational Therapy Treatment Patient Details Name: Jacqueline Willis MRN: [id number] DOB: May 16, 1965 Today's Date: 04/06/2023   History of present illness Pt admitted from home due to uncontrolled pain following R TKR 04/01/23; pt d/c home same day.   OT comments  The pt expressed concerns about performing lower body self-care tasks, as this has been difficult. As such, OT educated her on compensatory strategies to perform, including use of lower body AE for dressing and bathing, including using a reacher, sock aid, and long-handled sponge. She performed appropriate teach back and presented with good overall understanding and recall. Continue OT services in the acute setting. Pt likely to require only 1-2 additional OT visits during this hospital stay.        If plan is discharge home, recommend the following:  A little help with bathing/dressing/bathroom;Assistance with cooking/housework;Assist for transportation   Equipment Recommendations  Tub/shower bench;Other (comment) (Reacher, sock aid, long-handled sponge)    Recommendations for Other Services      Precautions / Restrictions Precautions Precautions: Knee Restrictions Other Position/Activity Restrictions: WBAT       Mobility Bed Mobility Overal bed mobility: Needs Assistance Bed Mobility: Supine to Sit     Supine to sit: Supervision Sit to supine: Supervision        Transfers Overall transfer level: Needs assistance Equipment used: Rolling walker (2 wheels)   Sit to Stand: Supervision                 Balance     Sitting balance-Leahy Scale: Good           ADL either performed or assessed with clinical judgement   ADL Overall ADL's : Needs assistance/impaired      Lower Body Dressing: Minimal assistance;Sitting/lateral leans;Sit to/from stand;With adaptive equipment;Cueing for compensatory techniques                 General ADL Comments: Pt expressed concerns about being able to perform  lower body dressing tasks, as she has been having difficulty in this regard. As such, OT instructed her compensatory strategies and use of AE to perform. Specifically, she was educated on ue of a reacher to doff socks, sock aid to don socks, and reacher to donn pants/underwear. She provided teach back as pertains to this with SBA to min assist. OT further educated her on the use of a long-handled sponge to use for lower body bathing, as well as provided verbal instruction on performing a safe tub transfer in the home; recommendation was also provided for use of a hand-held shower hose. Lastly, OT provided option for use of a toilet riser to facilitate improved ease with toilet transfers.               Cognition Arousal: Alert Behavior During Therapy: WFL for tasks assessed/performed Overall Cognitive Status: Within Functional Limits for tasks assessed                        Pertinent Vitals/ Pain       Pain Assessment Pain Assessment: 0-10 Pain Score: 4  Pain Location: R knee Pain Intervention(s): Limited activity within patient's tolerance, Monitored during session, Other (comment) (She reported receiving pain medication)         Frequency  Min 1X/week        Progress Toward Goals  OT Goals(current goals can now be found in the care plan section)  Progress towards OT goals: Progressing toward goals  Acute Rehab OT Goals OT Goal  Formulation: With patient Time For Goal Achievement: 04/19/23 Potential to Achieve Goals: Good  Plan         AM-PAC OT "6 Clicks" Daily Activity     Outcome Measure   Help from another person eating meals?: None Help from another person taking care of personal grooming?: None Help from another person toileting, which includes using toliet, bedpan, or urinal?: A Little Help from another person bathing (including washing, rinsing, drying)?: A Little Help from another person to put on and taking off regular upper body clothing?: None Help  from another person to put on and taking off regular lower body clothing?: A Little 6 Click Score: 21    End of Session Equipment Utilized During Treatment: Rolling walker (2 wheels);Other (comment) (lower body adaptive equipment)  OT Visit Diagnosis: Pain;Unsteadiness on feet (R26.81) Pain - Right/Left: Right Pain - part of body: Knee   Activity Tolerance Patient tolerated treatment well   Patient Left in bed;with call bell/phone within reach   Nurse Communication Mobility status        Time: 1610-9604 OT Time Calculation (min): 20 min  Charges: OT General Charges $OT Visit: 1 Visit OT Treatments $Self Care/Home Management : 8-22 mins     Reuben Likes, OTR/L 04/06/2023, 5:32 PM

## 2023-04-06 NOTE — Progress Notes (Signed)
Physical Therapy Treatment Patient Details Name: Jacqueline Willis MRN: [id number] DOB: 1966-02-01 Today's Date: 04/06/2023   History of Present Illness Pt admitted from home 2* uncontrolled pain following R TKR 04/01/23 with dc home same day.    PT Comments  POD # 5  Admit POD # 2 due to PAIN General Comments: Pt having issues of constipation.  Per RN "went to bathroom earlier and was c/o dizziness".  Pt currently back in bed resting.  Pt still c/o MAX knee pain and "bad night last night". Assisted OOB to amb was difficult.  General bed mobility comments: pt self able to slide LE and prefers NOT to use strap. General transfer comment: good use of hands to steady self.  Goof safet6y cognition.  Mild anxiety.  Mod fear of creating "more pain".  Reports 10/10 pain with standing but is allowing FWBing. General Gait Details: "I want to walki" despite reports 10/10 pain but also Pt stated "the pain eases up".  Tolerated amb a short distance but with excessive lean on walker.  No reports of dizziness. Pt unable to perform any TE's other than AP/knee presses.  ICE applied.   Per RN, Pt NOT D/Cing to home today due to issues of pain control and constipation.    If plan is discharge home, recommend the following: A little help with walking and/or transfers;Assist for transportation;Help with stairs or ramp for entrance;Assistance with cooking/housework   Can travel by private vehicle        Equipment Recommendations  None recommended by PT    Recommendations for Other Services       Precautions / Restrictions Precautions Precautions: Knee;Fall Precaution Comments: no pillow under knee Restrictions Weight Bearing Restrictions: No Other Position/Activity Restrictions: WBAT     Mobility  Bed Mobility Overal bed mobility: Needs Assistance Bed Mobility: Supine to Sit     Supine to sit: Supervision     General bed mobility comments: pt self able to slide LE and prefers NOT to use strap.     Transfers Overall transfer level: Needs assistance Equipment used: Rolling walker (2 wheels) Transfers: Sit to/from Stand Sit to Stand: Supervision           General transfer comment: good use of hands to steady self.  Goof safet6y cognition.  Mild anxiety.  Mod fear of creating "more pain".  Reports 10/10 pain with standing but is allowing FWBing.    Ambulation/Gait Ambulation/Gait assistance: Supervision, Contact guard assist Gait Distance (Feet): 35 Feet Assistive device: Rolling walker (2 wheels) Gait Pattern/deviations: Step-to pattern, Decreased step length - right, Decreased step length - left, Shuffle, Trunk flexed Gait velocity: decreased     General Gait Details: "I want to walki" despite reports 10/10 pain but also Pt stated "the pain eases up".  Tolerated amb a short distance but with excessive lean on walker.  No reports of dizziness.   Stairs             Wheelchair Mobility     Tilt Bed    Modified Rankin (Stroke Patients Only)       Balance                                            Cognition Arousal: Alert   Overall Cognitive Status: Within Functional Limits for tasks assessed  General Comments: Pt having issues of constipation.  Per RN "went to bathroom earlier and was c/o dizziness".  Pt currently back in bed resting.  Pt still c/o MAX knee pain and "bad night last night".        Exercises      General Comments        Pertinent Vitals/Pain Pain Assessment Pain Assessment: 0-10 Pain Score: 10-Worst pain ever Pain Location: R knee Pain Descriptors / Indicators: Aching, Sore, Discomfort, Grimacing, Guarding Pain Intervention(s): Monitored during session, Repositioned, Patient requesting pain meds-RN notified, Ice applied    Home Living                          Prior Function            PT Goals (current goals can now be found in the care plan  section) Progress towards PT goals: Progressing toward goals    Frequency    7X/week      PT Plan      Co-evaluation              AM-PAC PT "6 Clicks" Mobility   Outcome Measure  Help needed turning from your back to your side while in a flat bed without using bedrails?: A Little Help needed moving from lying on your back to sitting on the side of a flat bed without using bedrails?: A Little Help needed moving to and from a bed to a chair (including a wheelchair)?: A Little Help needed standing up from a chair using your arms (e.g., wheelchair or bedside chair)?: A Little Help needed to walk in hospital room?: A Little Help needed climbing 3-5 steps with a railing? : A Little 6 Click Score: 18    End of Session Equipment Utilized During Treatment: Gait belt Activity Tolerance: Patient limited by pain Patient left: in chair;with call bell/phone within reach;with chair alarm set Nurse Communication: Mobility status;Patient requests pain meds PT Visit Diagnosis: Unsteadiness on feet (R26.81);Muscle weakness (generalized) (M62.81);Difficulty in walking, not elsewhere classified (R26.2);Pain Pain - Right/Left: Right     Time: 1105-1130 PT Time Calculation (min) (ACUTE ONLY): 25 min  Charges:    $Gait Training: 8-22 mins $Therapeutic Activity: 8-22 mins PT General Charges $$ ACUTE PT VISIT: 1 Visit                     Felecia Shelling  PTA Acute  Rehabilitation Services Office M-F          [phone]

## 2023-04-06 NOTE — Progress Notes (Signed)
[facility] PROGRESS NOTE   Sharlin Bek GMW:[id number] DOB: 11/27/1965 DOA: 04/03/2023  PCP: Crist Fat, MD  Brief History:  57 y.o. 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  Imaging studies did not show any fractures.  Orthopedics was consulted.  She was also noted to have new anemia.  She was hospitalized for further management.  Consultants: Orthopedics  Procedures: None yet    Subjective/Interval History: Patient mentioned that she had severe pain overnight.  This is in the right knee.  Still has not had a good bowel movement.  No other complaints offered.  No nausea vomiting.  No abdominal pain.     Assessment/Plan:  Severe pain right knee She is status post total knee arthroplasty of the right knee on 12/5.  Presented with severe pain.  Imaging studies did not show any acute findings.  No DVT on ultrasound. Orthopedics has seen the patient.  She was given a trial of Toradol. Since she is on anticoagulation would like to avoid NSAIDs.   She was started on scheduled Percocet with some improvement in pain but pain worsened overnight.  Seen by PT and OT.  Home health is recommended.   Felt a bit dizzy this morning when she went to use the bathroom.  Vital signs are stable.  Constipation Likely secondary to recent surgery, pain, pain medications.  Has not had a bowel movement in several days She was started on aggressive bowel regimen without any relief.   Enema to be given this morning.  Continue with aggressive bowel regimen.  Normocytic anemia Patient's hemoglobin was normal on 12/3 when it was 13.5.   Noted to be 7.2 at the time of admission.  Platelet counts were also noted to be low.   Hemoglobin improved spontaneously.  Stable over the last 2 days.  Platelet counts have also rebounded.  The initial value may have been  erroneous. Anemia panel was done which showed ferritin of 105, iron 21, TIBC 290, percent saturation 7.  Folic acid 19.3.  Vitamin B12 493. LFTs are unremarkable. Patient was reassured.  History of PE and DVT This was several years ago.  Patient remains on Eliquis.  No DVT identified on Doppler studies done during this hospitalization.   DVT Prophylaxis: On Eliquis and aspirin Code Status: Full code Family Communication: Discussed with patient Disposition Plan: Home with home health when medically stable.  Felt dizzy this morning.  Has not had a bowel movement yet.  High risk for readmission if she is discharged today in this condition.  We will watch her for another day.     Medications: Scheduled:  apixaban  2.5 mg Oral BID   aspirin  81 mg Oral Daily   oxyCODONE-acetaminophen  1 tablet Oral TID   polyethylene glycol  17 g Oral BID   senna-docusate  2 tablet Oral BID   Continuous: YQI:[id number] **OR** acetaminophen, bisacodyl, bisacodyl, diphenhydrAMINE-zinc acetate, HYDROmorphone (DILAUDID) injection, methocarbamol (ROBAXIN) injection, ondansetron **OR** ondansetron (ZOFRAN) IV, oxyCODONE, sodium phosphate  Antibiotics: Anti-infectives (From admission, onward)    None       Objective:  Vital Signs  Vitals:   04/05/23 1339 04/05/23 2306 04/06/23 0545 04/06/23 1003  BP: 116/66 106/62 (!) 101/58 135/65  Pulse: 85 85 86 86  Resp: 16 17 18 16   Temp: 98 F (36.7 C) 98.1 F (36.7 C) 99.4 F (37.4 C)  99.3 F (37.4 C)  TempSrc:  Oral Oral Oral  SpO2: 99% 96% 93% 97%  Weight:      Height:        Intake/Output Summary (Last 24 hours) at 04/06/2023 1042 Last data filed at 04/06/2023 0635 Gross per 24 hour  Intake 570 ml  Output --  Net 570 ml   Filed Weights   04/03/23 1118  Weight: 72.6 kg    General appearance: Awake alert.  In no distress Resp: Clear to auscultation bilaterally.  Normal effort Cardio: S1-S2 is normal regular.  No S3-S4.  No rubs  murmurs or bruit GI: Abdomen is soft.  Nontender nondistended.  Bowel sounds are present normal.  No masses organomegaly  Lab Results:  Data Reviewed: I have personally reviewed following labs and reports of the imaging studies  CBC: Recent Labs  Lab 04/03/23 1205 04/04/23 0603 04/04/23 1424 04/05/23 0340 04/06/23 0307  WBC 5.6 5.8 7.1 5.5 4.9  NEUTROABS 3.5  --   --   --   --   HGB 7.2* 9.5* 9.6* 9.8* 9.5*  HCT 22.3* 28.8* 29.6* 30.1* 30.1*  MCV 100.0 97.6 96.7 97.7 99.0  PLT 132* 164 182 193 239    Basic Metabolic Panel: Recent Labs  Lab 04/03/23 1205 04/04/23 0313 04/05/23 0340  NA 139 136 135  K 3.7 3.7 3.6  CL 105 98 101  CO2 27 32 28  GLUCOSE 87 117* 132*  BUN 17 15 16   CREATININE 0.63 0.70 0.64  CALCIUM 7.7* 8.3* 8.1*    GFR: Estimated Creatinine Clearance: 74.9 mL/min (by C-G formula based on SCr of 0.64 mg/dL).  Liver Function Tests: Recent Labs  Lab 04/04/23 0603  AST 18  ALT 14  ALKPHOS 46  BILITOT 0.9  PROT 6.0*  ALBUMIN 2.9*    Anemia Panel: Recent Labs    04/03/23 1322 04/03/23 1404  VITAMINB12  --  493  FOLATE 19.3  --   FERRITIN  --  105  TIBC  --  290  IRON  --  21*  RETICCTPCT  --  2.0    Recent Results (from the past 240 hour(s))  Surgical pcr screen     Status: Abnormal   Collection Time: 03/30/23  8:15 AM   Specimen: Nasal Mucosa; Nasal Swab  Result Value Ref Range Status   MRSA, PCR NEGATIVE NEGATIVE Final   Staphylococcus aureus POSITIVE (A) NEGATIVE Final    Comment: (NOTE) The Xpert SA Assay (FDA approved for NASAL specimens in patients 69 years of age and older), is one component of a comprehensive surveillance program. It is not intended to diagnose infection nor to guide or monitor treatment. Performed at [facility], [address] [place], [place] [address]       Radiology Studies: No results found.     LOS: 2 days   Krew Hortman Rito Ehrlich  [facility] Pager on  [email or web address]  04/06/2023, 10:42 AM

## 2023-04-07 ENCOUNTER — Other Ambulatory Visit (HOSPITAL_COMMUNITY): Payer: Self-pay

## 2023-04-07 DIAGNOSIS — D649 Anemia, unspecified: Secondary | ICD-10-CM | POA: Diagnosis not present

## 2023-04-07 DIAGNOSIS — Z86711 Personal history of pulmonary embolism: Secondary | ICD-10-CM | POA: Diagnosis not present

## 2023-04-07 DIAGNOSIS — M25561 Pain in right knee: Secondary | ICD-10-CM | POA: Diagnosis not present

## 2023-04-07 DIAGNOSIS — Z86718 Personal history of other venous thrombosis and embolism: Secondary | ICD-10-CM | POA: Diagnosis not present

## 2023-04-07 NOTE — Progress Notes (Signed)
Physical Therapy Treatment Patient Details Name: Jacqueline Willis MRN: [id number] DOB: [birth year] Today's Date: 04/07/2023   History of Present Illness Pt admitted from home 2* uncontrolled pain following R TKR 04/01/23 with dc home same day.    PT Comments  POD # 6 General Comments: AxO x 3 feeling "a little better" and eager to go home today.  Pian is better controlled and pt has had multiple BM's.  Admits she does not feel 100% but "enough" to D/C. Assisted with amb in hallway, practiced stairs Then returned to room to perform some TE's following HEP handout.  Instructed on proper tech, freq as well as use of ICE.   Addressed all mobility questions, discussed appropriate activity, educated on use of ICE.  Pt ready for D/C to home.    If plan is discharge home, recommend the following: A little help with walking and/or transfers;Assist for transportation;Help with stairs or ramp for entrance;Assistance with cooking/housework   Can travel by private vehicle        Equipment Recommendations  None recommended by PT    Recommendations for Other Services       Precautions / Restrictions Precautions Precautions: Knee Precaution Comments: no pillow under knee Restrictions Weight Bearing Restrictions: No Other Position/Activity Restrictions: WBAT     Mobility  Bed Mobility Overal bed mobility: Needs Assistance       Supine to sit: Modified independent (Device/Increase time)     General bed mobility comments: pt self able to slide LE and prefers NOT to use strap.    Transfers Overall transfer level: Needs assistance   Transfers: Sit to/from Stand Sit to Stand: Modified independent (Device/Increase time)           General transfer comment: good use of hands to steady self.  Goof safet6y cognition.  Mild anxiety.  Mod fear of creating "more pain".  Reports 10/10 pain with standing but is allowing FWBing.    Ambulation/Gait Ambulation/Gait assistance: Modified  independent (Device/Increase time), Supervision Gait Distance (Feet): 40 Feet Assistive device: Rolling walker (2 wheels) Gait Pattern/deviations: Step-to pattern, Decreased step length - right, Decreased step length - left, Shuffle, Trunk flexed Gait velocity: decreased     General Gait Details: pt tolerated a functional distance amb in hallway as well as to and from bathroom all at Supervision level.  Pt able to self toilet and perfrom peri care as well.   Stairs Stairs: Yes Stairs assistance: Min assist Stair Management: No rails, Backwards, With walker Number of Stairs: 2 General stair comments: pt was able to recall proper tech up backward with walker from performing yesterday with Spouse.   Wheelchair Mobility     Tilt Bed    Modified Rankin (Stroke Patients Only)       Balance                                            Cognition Arousal: Alert Behavior During Therapy: WFL for tasks assessed/performed Overall Cognitive Status: Within Functional Limits for tasks assessed                                 General Comments: AxO x 3 feeling "a little better" and eager to go home today.  Pian is better controlled and pt has had multiple BM's.  Admits she does not feel  100% but "enough" to D/C.        Exercises  Total Knee Replacement TE's following HEP handout 10 reps B LE ankle pumps 05 reps towel squeezes 05 reps knee presses 05 reps heel slides  05 reps SAQ's 05 reps SLR's 05 reps ABD Educated on use of gait belt to assist with TE's Followed by ICE     General Comments        Pertinent Vitals/Pain Pain Assessment Pain Assessment: 0-10 Pain Score: 8  Pain Location: R knee Pain Descriptors / Indicators: Aching, Sore, Discomfort, Grimacing, Guarding Pain Intervention(s): Monitored during session, Premedicated before session, Repositioned, Ice applied    Home Living                          Prior Function             PT Goals (current goals can now be found in the care plan section)      Frequency    7X/week      PT Plan      Co-evaluation              AM-PAC PT "6 Clicks" Mobility   Outcome Measure  Help needed turning from your back to your side while in a flat bed without using bedrails?: A Little Help needed moving from lying on your back to sitting on the side of a flat bed without using bedrails?: A Little Help needed moving to and from a bed to a chair (including a wheelchair)?: A Little Help needed standing up from a chair using your arms (e.g., wheelchair or bedside chair)?: A Little Help needed to walk in hospital room?: A Little Help needed climbing 3-5 steps with a railing? : A Little 6 Click Score: 18    End of Session Equipment Utilized During Treatment: Gait belt Activity Tolerance: Patient limited by pain Patient left: in chair;with call bell/phone within reach;with chair alarm set Nurse Communication: Mobility status;Patient requests pain meds PT Visit Diagnosis: Unsteadiness on feet (R26.81);Muscle weakness (generalized) (M62.81);Difficulty in walking, not elsewhere classified (R26.2);Pain Pain - Right/Left: Right Pain - part of body: Knee     Time: 1914-7829 PT Time Calculation (min) (ACUTE ONLY): 36 min  Charges:    $Gait Training: 8-22 mins $Therapeutic Exercise: 8-22 mins PT General Charges $$ ACUTE PT VISIT: 1 Visit                    Felecia Shelling  PTA Acute  Rehabilitation Services Office M-F          [phone]

## 2023-04-07 NOTE — Progress Notes (Signed)
Reviewed written d/c instructions w pt and all questions answered. She verbalized understanding. D/C via w/c w all belongings in stable condition. 

## 2023-04-07 NOTE — Plan of Care (Signed)

## 2023-04-07 NOTE — Discharge Summary (Signed)
Physician Discharge Summary  Jacqueline Willis ZOX:[id number] DOB: 09/22/1965 DOA: 04/03/2023  PCP: Crist Fat, MD  Admit date: 04/03/2023 Discharge date: 04/07/2023  1:11 PM  Admitted From: Home Disposition: Home Recommendations for Outpatient Follow-up:  Follow up with PCP in 1 week Outpatient follow-up with orthopedic surgery as previously planned Check CMP and CBC at follow-up Please follow up on the following pending results: None  Home Health: [facility] PT/OT Equipment/Devices: Patient has appropriate DME  Discharge Condition: Stable CODE STATUS: Full code  Follow-up Information     Crist Fat, MD Follow up.   Specialty: Internal Medicine Why: post hospitalization follow up Contact information: [address] [place] [address] [phone]         Samson Frederic, MD Follow up.   Specialty: Orthopedic Surgery Contact information: [address] [place] [address] [phone]                 Hospital course 57 year old F with PMH of PE/DVT on Eliquis, osteoarthritis and recent right TKA on 12/5 returning with severe right knee pain.  Imaging studies did not show any acute finding.  No DVT on ultrasound.  Orthopedic surgery consulted and recommended pain management and outpatient follow-up.   Patient's symptoms eventually improved, and she is discharged on pain medication including Percocet and on Robaxin for outpatient follow-up with orthopedic surgery.  HH PT/OT resumed.  See individual problem list below for more.   Problems addressed during this hospitalization Severe pain right knee with recent right TKA on 12/5-imaging studies and lower extremity venous Doppler without acute finding.  Evaluated by orthopedic surgery and cleared for discharge for outpatient follow-up.  Pain improved. -Multimodal pain control with as needed Tylenol, Robaxin and Percocet. -Recommend taking Senokot-S 2 tablets twice daily for  constipation -Already on Eliquis that would start VTE prophylaxis. -HH PT/OT    Constipation: Had a small bowel movement.  Abdominal exam benign. -Patient to continue Senokot-S 2 tablets twice daily at home   Normocytic anemia: Initial Hgb 7.2 and spontaneously improved to 9.5 and remained stable.  Initial Hgb likely erroneous.  No nutritional deficiency on anemia panel. 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  Time spent 35 minutes  Vital signs Vitals:   04/06/23 1003 04/06/23 1351 04/06/23 2135 04/07/23 0515  BP: 135/65 118/72 130/63 (!) 118/52  Pulse: 86 88 90 84  Temp: 99.3 F (37.4 C) 98 F (36.7 C) 99.4 F (37.4 C) 98.3 F (36.8 C)  Resp: 16 16 16 16   Height:      Weight:      SpO2: 97% 100% 100% 97%  TempSrc: Oral  Oral Oral  BMI (Calculated):         Discharge exam  GENERAL: No apparent distress.  Nontoxic. HEENT: MMM.  Vision and hearing grossly intact.  NECK: Supple.  No apparent JVD.  RESP:  No IWOB.  Fair aeration bilaterally. CVS:  RRR. Heart sounds normal.  ABD/GI/GU: BS+. Abd soft, NTND.  MSK/EXT:  Moves extremities.  Dressing over right TKA DCI.  No swelling or erythema.  Neurovascular intact.Marland Kitchen SKIN: As above. NEURO: Awake and alert. Oriented appropriately.  No apparent focal neuro deficit. PSYCH: Calm. Normal affect.  Discharge Instructions Discharge Instructions     Call MD for:  difficulty breathing, headache or visual disturbances   Complete by: As directed    Call MD for:  extreme fatigue   Complete by: As directed    Call MD for:  persistant dizziness or light-headedness   Complete by: As directed    Call MD for:  persistant nausea and vomiting   Complete by: As directed    Call MD for:   severe uncontrolled pain   Complete by: As directed    Call MD for:  temperature >100.4   Complete by: As directed    Diet general   Complete by: As directed    Discharge instructions   Complete by: As directed    Please follow-up with your orthopedic surgeon for further problems with your right knee  You were cared for by a hospitalist during your hospital stay. If you have any questions about your discharge medications or the care you received while you were in the hospital after you are discharged, you can call the unit and asked to speak with the hospitalist on call if the hospitalist that took care of you is not available. Once you are discharged, your primary care physician will handle any further medical issues. Please note that NO REFILLS for any discharge medications will be authorized once you are discharged, as it is imperative that you return to your primary care physician (or establish a relationship with a primary care physician if you do not have one) for your aftercare needs so that they can reassess your need for medications and monitor your lab values. If you do not have a primary care physician, you can call [phone] for a physician referral.   Increase activity slowly   Complete by: As directed       Allergies as of 04/07/2023       Reactions   Sulfa Antibiotics Anaphylaxis   Vancomycin Itching        Medication List     STOP taking these medications    docusate sodium 100 MG capsule Commonly known as: Colace   oxyCODONE 5 MG immediate release tablet Commonly known as: Roxicodone   senna 8.6 MG Tabs tablet Commonly known as: SENOKOT       TAKE these medications    aspirin 81 MG chewable tablet Chew 81 mg by mouth daily.   Eliquis 2.5 MG Tabs tablet Generic drug: apixaban Take 1 tablet (2.5 mg total) by mouth 2 (two) times daily.   methocarbamol 500 MG tablet Commonly known as: ROBAXIN Take 1 tablet (500 mg total) by mouth every 6 (six) hours as  needed. What changed: reasons to take this   multivitamin tablet Take 1 tablet by mouth daily.   mupirocin ointment 2 % Commonly known as: BACTROBAN Place 1 Application into the nose 2 (two) times daily.   ondansetron 4 MG tablet Commonly known as: Zofran Take 1 tablet (4 mg total) by mouth every 8 (eight) hours as needed for nausea or vomiting.   oxyCODONE-acetaminophen 5-325 MG tablet Commonly known as: PERCOCET/ROXICET Take 1 tablet by mouth every 6 (six) hours as needed for severe pain (pain score 7-10).   polyethylene glycol powder 17 GM/SCOOP powder Commonly known as: GLYCOLAX/MIRALAX Dissolve 17 g in liquid and take by mouth 2 (two) times daily. What changed:  when to take this reasons to take this   Stool Softener/Laxative 50-8.6 MG tablet Generic drug: senna-docusate Take 2 tablets by mouth 2 (two) times  daily.        Consultations: Orthopedic surgery  Procedures/Studies:   VAS [place] LOWER EXTREMITY VENOUS (DVT) (7a-7p)  Result Date: 04/04/2023  Lower Venous DVT Study Patient Name:  MAKHAILA HEATON  Date of Exam:   04/03/2023 Medical Rec #: [id number]        Accession #:    [id number] Date of Birth: 1965-08-22        Patient Gender: F Patient Age:   89 years Exam Location:  [facility] Procedure:      VAS [place] LOWER EXTREMITY VENOUS (DVT) Referring Phys: Evlyn Kanner --------------------------------------------------------------------------------  Indications: Pain.  Risk Factors: Surgery. Limitations: Poor ultrasound/tissue interface. Comparison Study: No prior studies. Performing Technologist: Chanda Busing RVT  Examination Guidelines: A complete evaluation includes B-mode imaging, spectral Doppler, color Doppler, and power Doppler as needed of all accessible portions of each vessel. Bilateral testing is considered an integral part of a complete examination. Limited examinations for reoccurring indications may be performed as noted. The reflux portion of the  exam is performed with the patient in reverse Trendelenburg.  +---------+---------------+---------+-----------+----------+--------------+ RIGHT    CompressibilityPhasicitySpontaneityPropertiesThrombus Aging +---------+---------------+---------+-----------+----------+--------------+ CFV      Full           Yes      Yes                                 +---------+---------------+---------+-----------+----------+--------------+ SFJ      Full                                                        +---------+---------------+---------+-----------+----------+--------------+ FV Prox  Full                                                        +---------+---------------+---------+-----------+----------+--------------+ FV Mid   Full                                                        +---------+---------------+---------+-----------+----------+--------------+ FV Distal               Yes      Yes                                 +---------+---------------+---------+-----------+----------+--------------+ PFV      Full                                                        +---------+---------------+---------+-----------+----------+--------------+ POP      Full           Yes      Yes                                 +---------+---------------+---------+-----------+----------+--------------+  PTV      Full                                                        +---------+---------------+---------+-----------+----------+--------------+ PERO     Full                                                        +---------+---------------+---------+-----------+----------+--------------+ Gastroc  Full                                                        +---------+---------------+---------+-----------+----------+--------------+   +----+---------------+---------+-----------+----------+--------------+ LEFTCompressibilityPhasicitySpontaneityPropertiesThrombus  Aging +----+---------------+---------+-----------+----------+--------------+ CFV Full           Yes      Yes                                 +----+---------------+---------+-----------+----------+--------------+    Summary: RIGHT: - There is no evidence of deep vein thrombosis in the lower extremity.  - No cystic structure found in the popliteal fossa.  LEFT: - No evidence of common femoral vein obstruction.   *See table(s) above for measurements and observations. Electronically signed by Carolynn Sayers on 04/04/2023 at 8:58:59 AM.    Final    DG Knee Complete 4 Views Right  Result Date: 04/03/2023 CLINICAL DATA:  Postop right knee pain. EXAM: RIGHT KNEE - COMPLETE 4+ VIEW COMPARISON:  04/01/2023. FINDINGS: Knee prosthetic components appear well seated and aligned and unchanged. No acute fracture. No bone lesion. Possible small effusion. Joint space air noted on the prior study has mostly resorbed. Soft tissue edema is similar. IMPRESSION: 1. No fracture or acute finding. 2. Knee prosthetic components appear well seated and aligned. Electronically Signed   By: Amie Portland M.D.   On: 04/03/2023 12:22   DG Knee Right Port  Result Date: 04/01/2023 CLINICAL DATA:  Status post right total knee arthroplasty. EXAM: PORTABLE RIGHT KNEE - 1-2 VIEW COMPARISON:  None Available. FINDINGS: Femoral, tibial and patellar components are well situated. Expected postoperative changes are noted anteriorly in around the distal femur. IMPRESSION: Status post right total knee arthroplasty. Electronically Signed   By: Lupita Raider M.D.   On: 04/01/2023 10:57   PERIPHERAL VASCULAR CATHETERIZATION  Result Date: 03/15/2023 Images from the original result were not included. 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 Randie Heinz, MD Procedure  Performed: 1.  Ultrasound-guided cannulation right common femoral vein 2.  IVC venogram 3.  Placement of infrarenal Cook Celect IVC filter Indications: 57 year old female history of DVT when she was pregnant and a subsequent PE without any evidence of DVT.  She is no longer on anticoagulation but does take aspirin daily.  She is planned for right total knee arthroplasty and we have discussed placing IVC filter to reduce thrombotic complications. Findings: Evaluated  left renal vein about 5 in the IVC measured less than 20 mm in the IVC filter was placed midline in the IVC just below the left renal vein.  Procedure:  The patient was identified in the holding area and taken to room 8.  The patient was then placed supine on the table and prepped and draped in the usual sterile fashion.  A time out was called.  Ultrasound was used to evaluate the right common femoral vein which was patent and compressible and the area was anesthetized 1% lidocaine and cannulated with micropuncture needle followed by wire and a sheath.  Concomitantly we administered Versed but fentanyl was not administered due to patient reluctance.  We then placed a Bentson wire followed by the dilator and placed the filter introducer sheath and performed IVC venogram.  With the above findings we then placed an IVC filter on the fluoroscopic guidance as below the left renal vein.  Still image was taken.  The introducer sheath was removed and pressure held over the sinuses was obtained.  Patient tolerated the procedure without any complication. Contrast: 15 cc Brandon C. Randie Heinz, MD [facility] Office: [phone] Pager: [phone]       The results of significant diagnostics from this hospitalization (including imaging, microbiology, ancillary and laboratory) are listed below for reference.     Microbiology: Recent Results (from the past 240 hour(s))  Surgical pcr screen     Status: Abnormal   Collection Time:  03/30/23  8:15 AM   Specimen: Nasal Mucosa; Nasal Swab  Result Value Ref Range Status   MRSA, PCR NEGATIVE NEGATIVE Final   Staphylococcus aureus POSITIVE (A) NEGATIVE Final    Comment: (NOTE) The Xpert SA Assay (FDA approved for NASAL specimens in patients 58 years of age and older), is one component of a comprehensive surveillance program. It is not intended to diagnose infection nor to guide or monitor treatment. Performed at [facility], [address], [place], [place] [address]      Labs:  CBC: Recent Labs  Lab 04/03/23 1205 04/04/23 0603 04/04/23 1424 04/05/23 0340 04/06/23 0307  WBC 5.6 5.8 7.1 5.5 4.9  NEUTROABS 3.5  --   --   --   --   HGB 7.2* 9.5* 9.6* 9.8* 9.5*  HCT 22.3* 28.8* 29.6* 30.1* 30.1*  MCV 100.0 97.6 96.7 97.7 99.0  PLT 132* 164 182 193 239   BMP &GFR Recent Labs  Lab 04/03/23 1205 04/04/23 0313 04/05/23 0340  NA 139 136 135  K 3.7 3.7 3.6  CL 105 98 101  CO2 27 32 28  GLUCOSE 87 117* 132*  BUN 17 15 16   CREATININE 0.63 0.70 0.64  CALCIUM 7.7* 8.3* 8.1*   Estimated Creatinine Clearance: 74.9 mL/min (by C-G formula based on SCr of 0.64 mg/dL). Liver & Pancreas: Recent Labs  Lab 04/04/23 0603  AST 18  ALT 14  ALKPHOS 46  BILITOT 0.9  PROT 6.0*  ALBUMIN 2.9*   No results for input(s): "LIPASE", "AMYLASE" in the last 168 hours. No results for input(s): "AMMONIA" in the last 168 hours. Diabetic: No results for input(s): "HGBA1C" in the last 72 hours. No results for input(s): "GLUCAP" in the last 168 hours. Cardiac Enzymes: No results for input(s): "CKTOTAL", "CKMB", "CKMBINDEX", "TROPONINI" in the last 168 hours. No results for input(s): "PROBNP" in the last 8760 hours. Coagulation Profile: No results for input(s): "INR", "PROTIME" in the last 168 hours. Thyroid Function Tests: No results  for input(s): "TSH", "T4TOTAL", "FREET4", "T3FREE", "THYROIDAB" in the last 72 hours. Lipid Profile: No results for  input(s): "CHOL", "HDL", "LDLCALC", "TRIG", "CHOLHDL", "LDLDIRECT" in the last 72 hours. Anemia Panel: No results for input(s): "VITAMINB12", "FOLATE", "FERRITIN", "TIBC", "IRON", "RETICCTPCT" in the last 72 hours. Urine analysis:    Component Value Date/Time   BILIRUBINUR negative 01/25/2023 1630   PROTEINUR Negative 01/25/2023 1630   UROBILINOGEN 0.2 01/25/2023 1630   NITRITE positive 01/25/2023 1630   LEUKOCYTESUR Trace (A) 01/25/2023 1630   Sepsis Labs: Invalid input(s): "PROCALCITONIN", "LACTICIDVEN"   SIGNED:  Almon Hercules, MD  [facility] 04/07/2023, 3:55 PM

## 2023-04-08 DIAGNOSIS — R262 Difficulty in walking, not elsewhere classified: Secondary | ICD-10-CM | POA: Diagnosis not present

## 2023-04-08 DIAGNOSIS — M25561 Pain in right knee: Secondary | ICD-10-CM | POA: Diagnosis not present

## 2023-04-09 ENCOUNTER — Other Ambulatory Visit: Payer: Self-pay

## 2023-04-09 DIAGNOSIS — Z86718 Personal history of other venous thrombosis and embolism: Secondary | ICD-10-CM

## 2023-04-12 ENCOUNTER — Other Ambulatory Visit (HOSPITAL_COMMUNITY): Payer: Self-pay

## 2023-04-13 DIAGNOSIS — M25561 Pain in right knee: Secondary | ICD-10-CM | POA: Diagnosis not present

## 2023-04-13 DIAGNOSIS — R262 Difficulty in walking, not elsewhere classified: Secondary | ICD-10-CM | POA: Diagnosis not present

## 2023-04-14 ENCOUNTER — Ambulatory Visit: Payer: BC Managed Care – PPO | Admitting: Student

## 2023-04-14 ENCOUNTER — Encounter: Payer: Self-pay | Admitting: Student

## 2023-04-14 VITALS — BP 114/62 | HR 88 | Temp 97.8°F | Resp 18 | Ht 64.0 in | Wt 161.2 lb

## 2023-04-14 DIAGNOSIS — M25561 Pain in right knee: Secondary | ICD-10-CM

## 2023-04-14 NOTE — Progress Notes (Signed)
Established Patient Office Visit  Subjective   Patient ID: Jacqueline Willis, female    DOB: 11/27/1965  Age: 57 y.o. MRN: [id number]  Chief Complaint  Patient presents with   Follow up from hospital    Hgb was 7 in Hospital she was told to follow up with primary Dr.  It went up to 9 and she was discharged. Dec. 5th she had rt knee total replacement. She was re admitted to Hospital on Dec. 7th due to extreme pain, was discharged on Dec. 11th.    HPI  Jacqueline Willis is a 57 year old female who presents for follow up after hospital admission for postoperative knee pain. She had a rt total knee replacement on 04/01/23. Her pain was not adequately controlled with percocet every 6 hours, therefore she went to the ER. From there she was admitted for severe right knee pain. During admission she received IV dilaudid for pain control. Reports show there was no new DVT by way of lower extremity venous doppler. CBC showed hgb of 7.2 that was down from 14.5 4 days prior to surgery, anemia panel was done  and showed no nutritional deficiency. Hemoglobin increased to 9.5 an and stable. They thought the initial reading may have been erroneous. The patient was discharged on 04/07/23 with instructions to follow up with PCP for repeat CMP and CBC. Resume OT/PT, continue Eliquis for VTE prophylaxis, Senakot stool softener.  Today she feels that her pain is controlled but at night notes increase of spasms that prohibit her from sleeping. Currently using percocet 5-325 mg, Robaxin 500 mg every 6 hours. She continues to monitor her bowel movements and denies any hematochezia, no hematuria, or melena. She will see Dr. Jovita Gamma PA on this coming Friday for evaluation.   Review of Systems  Constitutional: Negative.   Cardiovascular: Negative.   Gastrointestinal: Negative.   Genitourinary: Negative.   Musculoskeletal:  Positive for joint pain.  Skin: Negative.   Neurological: Negative.       Objective:      BP 114/62 (BP Location: Left Arm, Patient Position: Sitting)   Pulse 88   Temp 97.8 F (36.6 C) (Temporal)   Resp 18   Ht 5\' 4"  (1.626 m)   Wt 161 lb 4 oz (73.1 kg)   SpO2 98%   BMI 27.68 kg/m    Physical Exam Constitutional:      Appearance: Normal appearance.  Cardiovascular:     Rate and Rhythm: Normal rate and regular rhythm.     Pulses: Normal pulses.     Heart sounds: Normal heart sounds.  Pulmonary:     Effort: Pulmonary effort is normal.     Breath sounds: Normal breath sounds.  Abdominal:     General: Bowel sounds are normal.     Palpations: Abdomen is soft.     Tenderness: There is no abdominal tenderness.  Musculoskeletal:     Right knee: Swelling present. No erythema. Decreased range of motion. Tenderness present.     Comments: Surgical site negative for drainage, erythema, or increased warmth. Lower extremity with normal pulse and mild edema- non pitting.   Skin:    General: Skin is warm and dry.     Capillary Refill: Capillary refill takes less than 2 seconds.  Neurological:     General: No focal deficit present.     Mental Status: She is alert and oriented to person, place, and time.  Psychiatric:        Mood and  Affect: Mood normal.        Behavior: Behavior normal.      No results found for any visits on 04/14/23.  Last CBC Lab Results  Component Value Date   WBC 4.9 04/06/2023   HGB 9.5 (L) 04/06/2023   HCT 30.1 (L) 04/06/2023   MCV 99.0 04/06/2023   MCH 31.3 04/06/2023   RDW 12.1 04/06/2023   PLT 239 04/06/2023      The ASCVD Risk score (Arnett DK, et al., 2019) failed to calculate for the following reasons:   Unable to determine if patient is Non-Hispanic African American    Assessment & Plan:   Problem List Items Addressed This Visit     Right knee pain - Primary   We will recheck CMP and CBC to monitor hemoglobin. I strongly encouraged her to increase fluid intake even though her appetite has not fully returned. She can use  1000 mg of acetaminophen twice a day in between percocet dose. Patient verbalized understanding.       Relevant Orders   Comprehensive metabolic panel   CBC with Diff    No follow-ups on file.    Edwena Blow, NP

## 2023-04-14 NOTE — Assessment & Plan Note (Signed)
We will recheck CMP and CBC to monitor hemoglobin. I strongly encouraged her to increase fluid intake even though her appetite has not fully returned. She can use 1000 mg of acetaminophen twice a day in between percocet dose. Patient verbalized understanding.

## 2023-04-14 NOTE — Patient Instructions (Addendum)
Use a frozen water bottle with a wash cloth and apply pressure to the arch of your foot.   Stay hydrated--Its very important to decrease dehydration and constipation.   Use tylenol 1000 mg twice a day as needed for pain and headaches. Try using muscle relaxer at bedtime.

## 2023-04-15 DIAGNOSIS — R262 Difficulty in walking, not elsewhere classified: Secondary | ICD-10-CM | POA: Diagnosis not present

## 2023-04-15 DIAGNOSIS — M25561 Pain in right knee: Secondary | ICD-10-CM | POA: Diagnosis not present

## 2023-04-15 LAB — CBC WITH DIFFERENTIAL/PLATELET
Basophils Absolute: 0.1 10*3/uL (ref 0.0–0.2)
Basos: 1 %
EOS (ABSOLUTE): 0.1 10*3/uL (ref 0.0–0.4)
Eos: 2 %
Hematocrit: 34.7 % (ref 34.0–46.6)
Hemoglobin: 11.3 g/dL (ref 11.1–15.9)
Immature Grans (Abs): 0 10*3/uL (ref 0.0–0.1)
Immature Granulocytes: 0 %
Lymphocytes Absolute: 1.3 10*3/uL (ref 0.7–3.1)
Lymphs: 18 %
MCH: 31 pg (ref 26.6–33.0)
MCHC: 32.6 g/dL (ref 31.5–35.7)
MCV: 95 fL (ref 79–97)
Monocytes Absolute: 0.5 10*3/uL (ref 0.1–0.9)
Monocytes: 7 %
Neutrophils Absolute: 5.3 10*3/uL (ref 1.4–7.0)
Neutrophils: 72 %
Platelets: 406 10*3/uL (ref 150–450)
RBC: 3.65 x10E6/uL — ABNORMAL LOW (ref 3.77–5.28)
RDW: 11.6 % — ABNORMAL LOW (ref 11.7–15.4)
WBC: 7.3 10*3/uL (ref 3.4–10.8)

## 2023-04-15 LAB — COMPREHENSIVE METABOLIC PANEL
ALT: 11 [IU]/L (ref 0–32)
AST: 18 [IU]/L (ref 0–40)
Albumin: 3.9 g/dL (ref 3.8–4.9)
Alkaline Phosphatase: 83 [IU]/L (ref 44–121)
BUN/Creatinine Ratio: 26 — ABNORMAL HIGH (ref 9–23)
BUN: 18 mg/dL (ref 6–24)
Bilirubin Total: 0.3 mg/dL (ref 0.0–1.2)
CO2: 25 mmol/L (ref 20–29)
Calcium: 9.4 mg/dL (ref 8.7–10.2)
Chloride: 101 mmol/L (ref 96–106)
Creatinine, Ser: 0.69 mg/dL (ref 0.57–1.00)
Globulin, Total: 3 g/dL (ref 1.5–4.5)
Glucose: 86 mg/dL (ref 70–99)
Potassium: 4.3 mmol/L (ref 3.5–5.2)
Sodium: 139 mmol/L (ref 134–144)
Total Protein: 6.9 g/dL (ref 6.0–8.5)
eGFR: 101 mL/min/{1.73_m2} (ref >59)

## 2023-04-15 NOTE — Progress Notes (Signed)
I have reviewed your lab results. The hemoglobin and hematocrit levels have are back in normal ranges. Electrolytes and kidney functions also in normal ranges.

## 2023-04-16 DIAGNOSIS — Z96651 Presence of right artificial knee joint: Secondary | ICD-10-CM | POA: Diagnosis not present

## 2023-04-16 DIAGNOSIS — Z471 Aftercare following joint replacement surgery: Secondary | ICD-10-CM | POA: Diagnosis not present

## 2023-04-16 NOTE — Progress Notes (Signed)
Leak, Luetta Nutting, NP  Christianne Dolin, CMA I have reviewed your lab results. The hemoglobin and hematocrit levels have are back in normal ranges. Electrolytes and kidney functions also in normal ranges.  Pt. Informed of results

## 2023-04-19 DIAGNOSIS — M25561 Pain in right knee: Secondary | ICD-10-CM | POA: Diagnosis not present

## 2023-04-19 DIAGNOSIS — R262 Difficulty in walking, not elsewhere classified: Secondary | ICD-10-CM | POA: Diagnosis not present

## 2023-04-22 DIAGNOSIS — R262 Difficulty in walking, not elsewhere classified: Secondary | ICD-10-CM | POA: Diagnosis not present

## 2023-04-22 DIAGNOSIS — M25561 Pain in right knee: Secondary | ICD-10-CM | POA: Diagnosis not present

## 2023-04-27 DIAGNOSIS — R262 Difficulty in walking, not elsewhere classified: Secondary | ICD-10-CM | POA: Diagnosis not present

## 2023-04-27 DIAGNOSIS — M25561 Pain in right knee: Secondary | ICD-10-CM | POA: Diagnosis not present

## 2023-04-29 DIAGNOSIS — M25561 Pain in right knee: Secondary | ICD-10-CM | POA: Diagnosis not present

## 2023-04-29 DIAGNOSIS — R262 Difficulty in walking, not elsewhere classified: Secondary | ICD-10-CM | POA: Diagnosis not present

## 2023-05-04 DIAGNOSIS — M25561 Pain in right knee: Secondary | ICD-10-CM | POA: Diagnosis not present

## 2023-05-04 DIAGNOSIS — R262 Difficulty in walking, not elsewhere classified: Secondary | ICD-10-CM | POA: Diagnosis not present

## 2023-05-06 DIAGNOSIS — R262 Difficulty in walking, not elsewhere classified: Secondary | ICD-10-CM | POA: Diagnosis not present

## 2023-05-06 DIAGNOSIS — M25561 Pain in right knee: Secondary | ICD-10-CM | POA: Diagnosis not present

## 2023-05-10 ENCOUNTER — Encounter: Payer: Self-pay | Admitting: Internal Medicine

## 2023-05-10 ENCOUNTER — Ambulatory Visit: Payer: BC Managed Care – PPO | Admitting: Internal Medicine

## 2023-05-10 VITALS — BP 120/72 | HR 94 | Resp 18 | Ht 64.0 in | Wt 163.4 lb

## 2023-05-10 DIAGNOSIS — M722 Plantar fascial fibromatosis: Secondary | ICD-10-CM | POA: Insufficient documentation

## 2023-05-10 DIAGNOSIS — M25561 Pain in right knee: Secondary | ICD-10-CM

## 2023-05-10 DIAGNOSIS — G4733 Obstructive sleep apnea (adult) (pediatric): Secondary | ICD-10-CM

## 2023-05-10 MED ORDER — GABAPENTIN 300 MG PO CAPS: 300.0000 mg | ORAL_CAPSULE | Freq: Every day | ORAL | 2 refills | Status: DC

## 2023-05-10 MED ORDER — DICLOFENAC SODIUM 75 MG PO TBEC
75.0000 mg | DELAYED_RELEASE_TABLET | Freq: Two times a day (BID) | ORAL | 0 refills | Status: AC
Start: 2023-05-10 — End: 2023-05-20

## 2023-05-10 NOTE — Assessment & Plan Note (Signed)
 She is s/p right TKA where she is having some pain in her knee.  I am going to try her on gabapentin since she says the pain is like a sensitive dull aching pain.

## 2023-05-10 NOTE — Assessment & Plan Note (Signed)
 We will refer her back to pulmonary medicine with Dr. Heath Gold for reevaluation of her OSA.

## 2023-05-10 NOTE — Assessment & Plan Note (Signed)
 I gave her a hand out on plantar fascitis.  I want her to freeze a water bottle and roll her foot on this 5-6 times per day.  We will give her diclofenac and exercises.  If she does not improve in the next 3-4 weeks, we will refer to podiatry.

## 2023-05-10 NOTE — Progress Notes (Signed)
 Office Visit  Subjective   Patient ID: Jacqueline Willis   DOB: 02-18-1966   Age: 58 y.o.   MRN: [id number]   Chief Complaint Chief Complaint  Patient presents with   Acute Visit    Pain, right leg- knee     History of Present Illness Jacqueline Willis is a 58 yo female shoe comes in today for an acute visit for left foot pain.  Jacqueline Willis states that 1.5 months ago Jacqueline Willis began having pain at the base of Jacqueline Willis left heel.  Jacqueline Willis would walk without shoes in the house and Jacqueline Willis would have sharp pain and popping at the base of Jacqueline Willis heel.  Jacqueline Willis has been using ice on this.  Jacqueline Willis states that with wearing Jacqueline Willis shoes, the pain and popping are a lot less.    The patient also returns for followup of weight loss.  Jacqueline Willis was previously tried on adipex and wegovy.  Jacqueline Willis loss some weight with adipex at the beginning but then stopped losing weight and we stopped Jacqueline Willis adipex at the end of last year in 2024.  Jacqueline Willis had tried wegovy in the past but had side effects with blurred vision.  The patient was exercising up until Jacqueline Willis had Jacqueline Willis knee surgery.  Jacqueline Willis denies any fast food and Jacqueline Willis denies any soft drinks or sweet tea.  The patient did go for a right TKA on 04/01/2023.  Jacqueline Willis is currently still receiving outpatient PT for this still.  Jacqueline Willis states Jacqueline Willis has not been sleeping very well due to Jacqueline Willis having problems with sensitivity with Jacqueline Willis knee and how Jacqueline Willis positions Jacqueline Willis knee with sleep.  This is a throbbing pain   The patient also wants a referral back to sleep medicine due to Jacqueline Willis history of OSA.  Jacqueline Willis has a history of obstructive sleep apnea with a home sleep study test in 2022.  Jacqueline Willis has a CPAP but Jacqueline Willis stopped using it as Jacqueline Willis was not getting supplies for this.       Past Medical History Past Medical History:  Diagnosis Date   Arthritis    Chronic venous insufficiency    Complication of anesthesia    problem breathing after colonoscopy   Dercum disease    Fatty liver disease, nonalcoholic    History of DVT (deep vein thrombosis)    History of  pulmonary embolus (PE)    History of simple renal cyst    Hypercholesterolemia    OSA (obstructive sleep apnea)    Vitamin D  deficiency      Allergies Allergies  Allergen Reactions   Sulfa Antibiotics Anaphylaxis   Vancomycin Itching     Medications  Current Outpatient Medications:    apixaban  (ELIQUIS ) 2.5 MG TABS tablet, Take 1 tablet (2.5 mg total) by mouth 2 (two) times daily., Disp: 60 tablet, Rfl: 0   aspirin  81 MG chewable tablet, Chew 81 mg by mouth daily., Disp: , Rfl:    methocarbamol  (ROBAXIN ) 500 MG tablet, Take 1 tablet (500 mg total) by mouth every 6 (six) hours as needed. (Patient taking differently: Take 500 mg by mouth every 6 (six) hours as needed for muscle spasms.), Disp: 20 tablet, Rfl: 0   Multiple Vitamin (MULTIVITAMIN) tablet, Take 1 tablet by mouth daily., Disp: , Rfl:    mupirocin  ointment (BACTROBAN ) 2 %, Place 1 Application into the nose 2 (two) times daily., Disp: 22 g, Rfl: 0   oxyCODONE -acetaminophen  (PERCOCET/ROXICET) 5-325 MG tablet, Take 1 tablet by mouth every 6 (six) hours as needed for  severe pain (pain score 7-10)., Disp: 30 tablet, Rfl: 0   polyethylene glycol powder (GLYCOLAX /MIRALAX ) 17 GM/SCOOP powder, Dissolve 17 g in liquid and take by mouth 2 (two) times daily., Disp: 952 g, Rfl: 0   senna-docusate (SENOKOT-S) 8.6-50 MG tablet, Take 2 tablets by mouth 2 (two) times daily., Disp: 120 tablet, Rfl: 0   Review of Systems Review of Systems  Constitutional:  Positive for malaise/fatigue. Negative for chills and fever.  Eyes:  Negative for blurred vision and double vision.  Respiratory:  Negative for cough and shortness of breath.   Cardiovascular:  Negative for chest pain, palpitations and leg swelling.  Gastrointestinal:  Positive for constipation. Negative for abdominal pain, diarrhea, nausea and vomiting.  Genitourinary:  Negative for frequency.  Musculoskeletal:  Positive for back pain. Negative for myalgias.  Skin:  Negative for itching  and rash.  Neurological:  Negative for dizziness, weakness and headaches.  Endo/Heme/Allergies:  Negative for polydipsia.       Objective:    Vitals BP 120/72   Pulse 94   Resp 18   Ht 5' 4 (1.626 m)   Wt 163 lb 6.4 oz (74.1 kg)   SpO2 98%   BMI 28.05 kg/m    Physical Examination Physical Exam Constitutional:      Appearance: Normal appearance. Jacqueline Willis is not ill-appearing.  Cardiovascular:     Rate and Rhythm: Normal rate and regular rhythm.     Pulses: Normal pulses.     Heart sounds: No murmur heard.    No friction rub. No gallop.  Pulmonary:     Effort: Pulmonary effort is normal. No respiratory distress.     Breath sounds: No wheezing, rhonchi or rales.  Abdominal:     General: Bowel sounds are normal. There is no distension.     Palpations: Abdomen is soft.     Tenderness: There is no abdominal tenderness.  Musculoskeletal:     Right lower leg: No edema.     Left lower leg: No edema.  Skin:    General: Skin is warm and dry.     Findings: No rash.  Neurological:     General: No focal deficit present.     Mental Status: Jacqueline Willis is alert and oriented to person, place, and time.        Assessment & Plan:   OSA (obstructive sleep apnea) We will refer Jacqueline Willis back to pulmonary medicine with Dr. Beulah for reevaluation of Jacqueline Willis OSA.  Plantar fasciitis of left foot I gave Jacqueline Willis a hand out on plantar fascitis.  I want Jacqueline Willis to freeze a water  bottle and roll Jacqueline Willis foot on this 5-6 times per day.  We will give Jacqueline Willis diclofenac  and exercises.  If Jacqueline Willis does not improve in the next 3-4 weeks, we will refer to podiatry.  Right knee pain Jacqueline Willis is s/p right TKA where Jacqueline Willis is having some pain in Jacqueline Willis knee.  I am going to try Jacqueline Willis on gabapentin  since Jacqueline Willis says the pain is like a sensitive dull aching pain.    Return in about 3 months (around 08/08/2023).   Selinda Fleeta Finger, MD

## 2023-05-11 DIAGNOSIS — M25561 Pain in right knee: Secondary | ICD-10-CM | POA: Diagnosis not present

## 2023-05-11 DIAGNOSIS — R262 Difficulty in walking, not elsewhere classified: Secondary | ICD-10-CM | POA: Diagnosis not present

## 2023-05-14 DIAGNOSIS — Z471 Aftercare following joint replacement surgery: Secondary | ICD-10-CM | POA: Diagnosis not present

## 2023-05-14 DIAGNOSIS — Z96651 Presence of right artificial knee joint: Secondary | ICD-10-CM | POA: Diagnosis not present

## 2023-05-20 DIAGNOSIS — R262 Difficulty in walking, not elsewhere classified: Secondary | ICD-10-CM | POA: Diagnosis not present

## 2023-05-20 DIAGNOSIS — M25561 Pain in right knee: Secondary | ICD-10-CM | POA: Diagnosis not present

## 2023-05-27 DIAGNOSIS — R262 Difficulty in walking, not elsewhere classified: Secondary | ICD-10-CM | POA: Diagnosis not present

## 2023-05-27 DIAGNOSIS — M25561 Pain in right knee: Secondary | ICD-10-CM | POA: Diagnosis not present

## 2023-05-31 DIAGNOSIS — M1611 Unilateral primary osteoarthritis, right hip: Secondary | ICD-10-CM | POA: Diagnosis not present

## 2023-05-31 DIAGNOSIS — Z96651 Presence of right artificial knee joint: Secondary | ICD-10-CM | POA: Diagnosis not present

## 2023-05-31 DIAGNOSIS — Z471 Aftercare following joint replacement surgery: Secondary | ICD-10-CM | POA: Diagnosis not present

## 2023-06-03 DIAGNOSIS — R262 Difficulty in walking, not elsewhere classified: Secondary | ICD-10-CM | POA: Diagnosis not present

## 2023-06-03 DIAGNOSIS — M25561 Pain in right knee: Secondary | ICD-10-CM | POA: Diagnosis not present

## 2023-06-14 ENCOUNTER — Encounter: Payer: Self-pay | Admitting: Podiatry

## 2023-06-14 ENCOUNTER — Ambulatory Visit (INDEPENDENT_AMBULATORY_CARE_PROVIDER_SITE_OTHER): Payer: BC Managed Care – PPO | Admitting: Podiatry

## 2023-06-14 DIAGNOSIS — L300 Nummular dermatitis: Secondary | ICD-10-CM | POA: Diagnosis not present

## 2023-06-14 DIAGNOSIS — M79672 Pain in left foot: Secondary | ICD-10-CM

## 2023-06-14 DIAGNOSIS — L603 Nail dystrophy: Secondary | ICD-10-CM

## 2023-06-14 DIAGNOSIS — G8929 Other chronic pain: Secondary | ICD-10-CM

## 2023-06-14 DIAGNOSIS — L0100 Impetigo, unspecified: Secondary | ICD-10-CM | POA: Diagnosis not present

## 2023-06-14 DIAGNOSIS — L728 Other follicular cysts of the skin and subcutaneous tissue: Secondary | ICD-10-CM | POA: Diagnosis not present

## 2023-06-14 NOTE — Patient Instructions (Signed)
 You can combine 3 tablespoons of coconut oil and 10-15 drops of tea tree oil together and apply to the toenails once a day.    Look for urea 40% cream or ointment and apply to the thickened dry skin / calluses. This can be bought over the counter, at a pharmacy or online such as [employer].  Can also look for combination product with salicylic acid.  Some lower concentration over-the-counter creams include Lac-Hydrin and AmLactin.  You can also scrub the callus with white vinegar prior to showering and this will make it easier to gently file down with pumice stone or emery board

## 2023-06-14 NOTE — Progress Notes (Unsigned)
 Chief Complaint  Patient presents with   Nail Problem    NP. She has concern that she has a nail fungus. Nails look clear, but thin in some areas. She does have some spots that could be ingrown and a callous on the left bunion.  She is not diabetic and takes ASA 2    HPI: 58 y.o. female presents today concern for changes to her toenails and possible nail fungus.  Bilateral first and second toes most affected.  States that she has noticed this for over a year where the nails have become brittle with areas of yellow discoloration.  She feels that they are not growing out properly.  At times they do hurt her in the corners.  At the end of the visit she also notes some chronic heel pain she has been having states she experiences popping and cracking in the left heel.  Past Medical History:  Diagnosis Date   Arthritis    Chronic venous insufficiency    Complication of anesthesia    problem breathing after colonoscopy   Dercum disease    Fatty liver disease, nonalcoholic    History of DVT (deep vein thrombosis)    History of pulmonary embolus (PE)    History of simple renal cyst    Hypercholesterolemia    OSA (obstructive sleep apnea)    Vitamin D deficiency     Past Surgical History:  Procedure Laterality Date   ABDOMINAL HYSTERECTOMY     PARTIAL   ARTHROSCOPY KNEE W/ DRILLING Right 2009   HAND TENDON SURGERY  1983   INCONTINENCE SURGERY  2010   BLADDER SLING   IVC FILTER INSERTION N/A 03/15/2023   Procedure: IVC FILTER INSERTION;  Surgeon: Maeola Harman, MD;  Location: [facility] INVASIVE CV LAB;  Service: Cardiovascular;  Laterality: N/A;   KNEE ARTHROPLASTY Right 04/01/2023   Procedure: COMPUTER ASSISTED TOTAL KNEE ARTHROPLASTY;  Surgeon: Samson Frederic, MD;  Location: [facility] ORS;  Service: Orthopedics;  Laterality: Right;   NOSE SURGERY     REPLACEMENT TOTAL KNEE Right    WISDOM TOOTH EXTRACTION      Allergies  Allergen Reactions   Sulfa Antibiotics Anaphylaxis    Vancomycin Itching    ROS denies any nausea, vomiting, fever, chills, chest pain, shortness of breath   Physical Exam: There were no vitals filed for this visit.  General: The patient is alert and oriented x3 in no acute distress.  Dermatology: Bilateral first toenails and second, third toenails most notably are brittle at the ends with chalky consistency, portions of yellow discoloration.  No significant nail thickening noted.  Vascular: Palpable pedal pulses bilaterally. Capillary refill within normal limits.  No appreciable edema.  No erythema or calor.  Neurological: Light touch sensation grossly intact bilateral feet.   Musculoskeletal Exam: Some tenderness on palpation of left plantar medial calcaneal tubercle.  No gross orthopedic deformity noted.  Crepitus noted with gait.  Radiographic Exam:  Deferred  Assessment/Plan of Care: 1. Onychodystrophy      No orders of the defined types were placed in this encounter.  None  Discussed clinical findings with patient today.  # Onychodystrophy -Nail specimen obtained from affected toenails to be sent for fungal pathology -Discussed that nail changes can be from trauma over time versus nail fungus.  Discussed potential treatments for this including oral medication, topical medications, removal of the affected toenails.  Will have patient follow-up in 3 to 4 weeks to review pathology and proceed  with treatment plan. -Advised over-the-counter biotin supplementation and use of coconut oil mixed with tea tree oil as this may improve the appearance of the nails in the interim.  # Chronic left heel pain -Patient discussed this in passing towards the end of the visit.  We will obtain x-rays at follow-up -Did discuss acutely stretching regimen briefly along with use of good supportive sneakers.  Recommend RICE therapy with over-the-counter NSAIDs over the next 2 to 3 weeks in the interim.   Dachelle Molzahn L. Marchia Bond, AACFAS [facility]     [address] [place], [place] [address]                Office [phone]  Fax [phone]

## 2023-06-15 DIAGNOSIS — R5383 Other fatigue: Secondary | ICD-10-CM | POA: Diagnosis not present

## 2023-06-15 DIAGNOSIS — J4520 Mild intermittent asthma, uncomplicated: Secondary | ICD-10-CM | POA: Diagnosis not present

## 2023-06-15 DIAGNOSIS — G4733 Obstructive sleep apnea (adult) (pediatric): Secondary | ICD-10-CM | POA: Diagnosis not present

## 2023-06-15 DIAGNOSIS — R400 Somnolence: Secondary | ICD-10-CM | POA: Diagnosis not present

## 2023-06-21 ENCOUNTER — Other Ambulatory Visit: Payer: Self-pay

## 2023-06-21 ENCOUNTER — Ambulatory Visit (HOSPITAL_COMMUNITY)
Admission: RE | Admit: 2023-06-21 | Discharge: 2023-06-21 | Disposition: A | Discharge status: Home / Self Care | Payer: BC Managed Care – PPO | Source: Ambulatory Visit | Attending: Vascular Surgery | Admitting: Vascular Surgery

## 2023-06-21 ENCOUNTER — Ambulatory Visit (HOSPITAL_COMMUNITY)
Admission: RE | Disposition: A | Discharge status: Home / Self Care | Payer: Self-pay | Source: Ambulatory Visit | Attending: Vascular Surgery

## 2023-06-21 ENCOUNTER — Encounter (HOSPITAL_COMMUNITY): Payer: Self-pay | Admitting: Vascular Surgery

## 2023-06-21 DIAGNOSIS — R600 Localized edema: Secondary | ICD-10-CM | POA: Insufficient documentation

## 2023-06-21 DIAGNOSIS — Z96651 Presence of right artificial knee joint: Secondary | ICD-10-CM | POA: Insufficient documentation

## 2023-06-21 DIAGNOSIS — Z86718 Personal history of other venous thrombosis and embolism: Secondary | ICD-10-CM | POA: Diagnosis not present

## 2023-06-21 DIAGNOSIS — Z86711 Personal history of pulmonary embolism: Secondary | ICD-10-CM | POA: Diagnosis not present

## 2023-06-21 DIAGNOSIS — Z4589 Encounter for adjustment and management of other implanted devices: Secondary | ICD-10-CM | POA: Insufficient documentation

## 2023-06-21 HISTORY — PX: IVC FILTER REMOVAL: CATH118246

## 2023-06-21 LAB — POCT I-STAT, CHEM 8
BUN: 15 mg/dL (ref 6–20)
Calcium, Ion: 1.18 mmol/L (ref 1.15–1.40)
Chloride: 105 mmol/L (ref 98–111)
Creatinine, Ser: 0.8 mg/dL (ref 0.44–1.00)
Glucose, Bld: 101 mg/dL — ABNORMAL HIGH (ref 70–99)
HCT: 38 % (ref 36.0–46.0)
Hemoglobin: 12.9 g/dL (ref 12.0–15.0)
Potassium: 3.7 mmol/L (ref 3.5–5.1)
Sodium: 142 mmol/L (ref 135–145)
TCO2: 26 mmol/L (ref 22–32)

## 2023-06-21 SURGERY — IVC FILTER REMOVAL
Anesthesia: LOCAL

## 2023-06-21 MED ORDER — SODIUM CHLORIDE 0.9 % IV SOLN
INTRAVENOUS | Status: DC | PRN
  Administered 2023-06-21: 10 mL/h via INTRAVENOUS

## 2023-06-21 MED ORDER — MIDAZOLAM HCL 2 MG/2ML IJ SOLN
INTRAMUSCULAR | Status: DC | PRN
  Administered 2023-06-21 (×2): 1 mg via INTRAVENOUS

## 2023-06-21 MED ORDER — MIDAZOLAM HCL 2 MG/2ML IJ SOLN
INTRAMUSCULAR | Status: AC
  Filled 2023-06-21: qty 2

## 2023-06-21 MED ORDER — SODIUM CHLORIDE 0.9% FLUSH: 10.0000 mL | Freq: Two times a day (BID) | INTRAVENOUS | Status: DC

## 2023-06-21 MED ORDER — FENTANYL CITRATE (PF) 100 MCG/2ML IJ SOLN
INTRAMUSCULAR | Status: DC | PRN
  Administered 2023-06-21 (×3): 50 ug via INTRAVENOUS

## 2023-06-21 MED ORDER — FENTANYL CITRATE (PF) 100 MCG/2ML IJ SOLN
INTRAMUSCULAR | Status: AC
  Filled 2023-06-21: qty 2

## 2023-06-21 MED ORDER — LIDOCAINE HCL (PF) 1 % IJ SOLN
INTRAMUSCULAR | Status: AC
  Filled 2023-06-21: qty 30

## 2023-06-21 MED ORDER — HEPARIN (PORCINE) IN NACL 1000-0.9 UT/500ML-% IV SOLN
INTRAVENOUS | Status: DC | PRN
  Administered 2023-06-21: 500 mL

## 2023-06-21 SURGICAL SUPPLY — 13 items
CATH NAVICROSS ANGLED 90CM (MICROCATHETER) IMPLANT
CATH OMNI FLUSH 5F 65CM (CATHETERS) IMPLANT
COVER DOME SNAP 22 D (MISCELLANEOUS) IMPLANT
DEVICE ONE SNARE 10MM (MISCELLANEOUS) IMPLANT
GUIDEWIRE ANGLED .035X260CM (WIRE) IMPLANT
KIT MICROPUNCTURE NIT STIFF (SHEATH) IMPLANT
KIT SINGLE USE MANIFOLD (KITS) IMPLANT
KIT SYRINGE INJ CVI SPIKEX1 (MISCELLANEOUS) IMPLANT
SET CLOVERSNARE FLT RETRIEVAL (MISCELLANEOUS) IMPLANT
SHEATH DRYSEAL FLEX 18FR 33CM (SHEATH) IMPLANT
SHEATH PROBE COVER 6X72 (BAG) IMPLANT
TRAY PV CATH (CUSTOM PROCEDURE TRAY) ×1 IMPLANT
WIRE BENTSON .035X145CM (WIRE) IMPLANT

## 2023-06-21 NOTE — Op Note (Signed)
    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 Randie Heinz, MD Procedure Performed: IVC filter retrieval  Indications: 58 year old female with a history of DVT and PE now status post right total knee arthroplasty.  We had placed an IVC filter prior to orthopedic surgery to reduce her risk of thrombotic complications.  She is now indicated for retrieval.  Findings: The hook was embedded near the left renal vein.  The Glidewire was used to go around the apex of the filter and snare this and the hook was then snared and retrieved through an 18 [place] Gore dry seal sheath.   Procedure:  The patient was identified in the holding area and taken to room 8.  The patient was then placed supine on the table and prepped and draped in the usual sterile fashion.  A time out was called.  Ultrasound was used to evaluate the right IJ which was patent and compressible.  The area was anesthetized with 1% lidocaine.  Concomitantly we administered fentanyl and Versed as moderate sedation her vital signs were monitored throughout the case.  The right IJ was then cannulated with a micropuncture needle followed by wire and a sheath.  A Bentson wire was placed in the IVC under fluoroscopic guidance and we initially placed a [facility] retrieval sheath under fluoroscopic guidance.  I attempted to snare the hook of the filter but this was obviously embedded in the left sidewall at the level of the left renal vein.  I did use an angled catheter as well but this was not going to work.  The Bentson wire was then placed back into the IVC and an 18 [place] Gore dry seal sheath was placed under fluoroscopic guidance.  Using an Omni catheter I then went around the apex of the filter and snared the Glidewire and was able to straighten the filter off of the wall.   Through the same [facility] dry seal I then snared the hook of the filter and advanced the sheath over the filter.  The filter was retrieved noted to be totally intact and completion venography demonstrated maybe a slight amount of posterior extravasation however this was washed away immediately.  Patient was hemodynamically stable without any pain.  At this time the sheath was removed and pressure was held.  She tolerated the procedure without any complication.  Contrast: 7cc  Skilar Marcou C. Randie Heinz, MD [facility] Office: [phone] Pager: [phone]

## 2023-06-21 NOTE — Discharge Instructions (Signed)
 Site Care   This sheet gives you information about how to care for yourself after your procedure. Your health care provider may also give you more specific instructions. If you have problems or questions, contact your health care provider. What can I expect after the procedure? After the procedure, it is common to have: Bruising and tenderness at the catheter insertion area. Follow these instructions at home:  Insertion site care Follow instructions from your health care provider about how to take care of your insertion site. Make sure you: Wash your hands with soap and water before you change your bandage (dressing). If soap and water are not available, use hand sanitizer. Remove your dressing as told by your health care provider. In 24 hours Check your insertion site every day for signs of infection. Check for: Redness, swelling, or pain. Pus or a bad smell. Warmth. You may shower 24-48 hours after the procedure. Do not apply powder or lotion to the site.  Activity For 24 hours after the procedure, or as directed by your health care provider: Do not push or pull heavy objects with the affected arm. Do not drive yourself home from the hospital or clinic. You may drive 24 hours after the procedure unless your health care provider tells you not to. Do not lift anything that is heavier than 10 lb (4.5 kg), or the limit that you are told, until your health care provider says that it is safe.  For 24 hours

## 2023-06-21 NOTE — H&P (Signed)
 H+P   History of Present Illness: This is a 58 y.o. female history of previous dvt when pregnant and pe several years ago without evidence of dvt. No longer on anticoagulation. She does have edema of the Left leg does not wear compression. Plan for right TKA in December.        Past Medical History:  Diagnosis Date   Arthritis     Chronic venous insufficiency     Fatty liver disease, nonalcoholic     History of DVT (deep vein thrombosis)     History of pulmonary embolus (PE)     History of simple renal cyst     Hypercholesterolemia     OSA (obstructive sleep apnea)     Vitamin D deficiency                 Past Surgical History:  Procedure Laterality Date   ABDOMINAL HYSTERECTOMY        PARTIAL   ARTHROSCOPY KNEE W/ DRILLING Right 2009   HAND TENDON SURGERY   1983   INCONTINENCE SURGERY   2010    BLADDER SLING   NOSE SURGERY       WISDOM TOOTH EXTRACTION              Allergies      Allergies  Allergen Reactions   Sulfa Antibiotics Anaphylaxis   Vancomycin Itching               Prior to Admission medications   Medication Sig Start Date End Date Taking? Authorizing Provider  aspirin 81 MG chewable tablet Chew 81 mg by mouth daily.     Yes [provider]      Social History         Socioeconomic History   Marital status: Married      Spouse name: ADIO   Number of children: 3   Years of education: 12 + 4   Highest education level: Not on file  Occupational History   Not on file  Tobacco Use   Smoking status: Never   Smokeless tobacco: Never  Vaping Use   Vaping status: Never Used  Substance and Sexual Activity   Alcohol use: Never   Drug use: Never   Sexual activity: Yes  Other Topics Concern   Not on file  Social History Narrative   Not on file    Social Determinants of Health    Financial Resource Strain: Not on file  Food Insecurity: Not on file  Transportation Needs: Not on file  Physical Activity: Not on file  Stress: Not  on file  Social Connections: Not on file  Intimate Partner Violence: Not on file             Family History  Problem Relation Age of Onset   Diabetes Mother     Pancreatic cancer Mother     Renal Disease Mother     Bladder Cancer Father     COPD Father     Emphysema Father     Lung cancer Sister     Prostate cancer Brother            ROS: right knee pain     Physical Examination   Vitals:   06/21/23 0558  BP: 136/79  Pulse: 84  Resp: 18  Temp: 98 F (36.7 C)  SpO2: 98%      Aaox3 Non labored respirations Mild non pitting edema LLE   CBC Labs (Brief)  Component Value Date/Time    WBC 3.9 01/25/2023 0000    RBC 4.38 01/25/2023 0000    RBC 4.62 09/16/2021 0000    HGB 12.9 03/15/2023 0554    HGB 13.9 01/25/2023 0000    HCT 38.0 03/15/2023 0554    HCT 42.1 01/25/2023 0000    PLT 196 01/25/2023 0000    MCV 96 01/25/2023 0000    MCV 92 09/16/2021 0000    MCH 31.7 01/25/2023 0000    MCHC 33.0 01/25/2023 0000    RDW 11.4 (L) 01/25/2023 0000    LYMPHSABS 1.6 01/25/2023 0000    EOSABS 0.1 01/25/2023 0000    BASOSABS 0.1 01/25/2023 0000        BMET Labs (Brief)          Component Value Date/Time    NA 142 03/15/2023 0554    NA 144 01/25/2023 0000    K 3.8 03/15/2023 0554    CL 102 03/15/2023 0554    CO2 27 01/25/2023 0000    GLUCOSE 98 03/15/2023 0554    BUN 19 03/15/2023 0554    BUN 16 01/25/2023 0000    CREATININE 0.80 03/15/2023 0554    CALCIUM 9.4 01/25/2023 0000        COAGS: Recent Labs  No results found for: "INR", "PROTIME"       Non-Invasive Vascular Imaging:   No studies   ASSESSMENT/PLAN: This is a 58 y.o. female is s/p R TKA with previous thrombotic issues on 2 separate occasions. Plan for IVC filter removal today. Risks, benefits, alternatives discussed as well as possibility of inability to remove the ivc filter.    Eldene Plocher C. Randie Heinz, MD [facility] Office: [phone] Pager:  [phone]

## 2023-06-22 MED FILL — Lidocaine HCl Local Preservative Free (PF) Inj 1%: INTRAMUSCULAR | Qty: 30 | Status: AC

## 2023-06-28 ENCOUNTER — Other Ambulatory Visit: Payer: Self-pay | Admitting: Podiatry

## 2023-07-05 ENCOUNTER — Ambulatory Visit (INDEPENDENT_AMBULATORY_CARE_PROVIDER_SITE_OTHER)

## 2023-07-05 ENCOUNTER — Encounter: Payer: Self-pay | Admitting: Podiatry

## 2023-07-05 ENCOUNTER — Ambulatory Visit (INDEPENDENT_AMBULATORY_CARE_PROVIDER_SITE_OTHER): Payer: BC Managed Care – PPO | Admitting: Podiatry

## 2023-07-05 DIAGNOSIS — M79672 Pain in left foot: Secondary | ICD-10-CM

## 2023-07-05 DIAGNOSIS — B351 Tinea unguium: Secondary | ICD-10-CM | POA: Diagnosis not present

## 2023-07-05 DIAGNOSIS — G8929 Other chronic pain: Secondary | ICD-10-CM

## 2023-07-05 DIAGNOSIS — S93312A Subluxation of tarsal joint of left foot, initial encounter: Secondary | ICD-10-CM | POA: Diagnosis not present

## 2023-07-05 MED ORDER — CICLOPIROX 8 % EX SOLN: Freq: Every day | CUTANEOUS | 12 refills | Status: AC

## 2023-07-05 MED ORDER — MELOXICAM 15 MG PO TABS
15.0000 mg | ORAL_TABLET | Freq: Every day | ORAL | 0 refills | Status: AC
Start: 2023-07-05 — End: 2023-07-26

## 2023-07-05 NOTE — Progress Notes (Unsigned)
 Chief Complaint  Patient presents with   Foot Pain    Follow up for pathology of nails and to talk about left heel pain. Xrays are up, she states that the heel "pops" when she walks barfoot, pain is in the medial to mid part of the heel. Not diabetic and takes ASA 81    HPI: 58 y.o. female presents today to reveal nail pathology.  She also endorses continued left heel and lateral column pain.  This has been unchanged since last visit despite over-the-counter NSAIDs and RICE therapy.  Endorses a popping and cracking sensation when she walks barefoot.  This is audible.  He states that this is been going on for months at this point.  Past Medical History:  Diagnosis Date   Arthritis    Chronic venous insufficiency    Complication of anesthesia    problem breathing after colonoscopy   Dercum disease    Fatty liver disease, nonalcoholic    History of DVT (deep vein thrombosis)    History of pulmonary embolus (PE)    History of simple renal cyst    Hypercholesterolemia    OSA (obstructive sleep apnea)    Vitamin D deficiency     Past Surgical History:  Procedure Laterality Date   ABDOMINAL HYSTERECTOMY     PARTIAL   ARTHROSCOPY KNEE W/ DRILLING Right 2009   HAND TENDON SURGERY  1983   INCONTINENCE SURGERY  2010   BLADDER SLING   IVC FILTER INSERTION N/A 03/15/2023   Procedure: IVC FILTER INSERTION;  Surgeon: Maeola Harman, MD;  Location: [facility] INVASIVE CV LAB;  Service: Cardiovascular;  Laterality: N/A;   IVC FILTER REMOVAL N/A 06/21/2023   Procedure: IVC FILTER REMOVAL;  Surgeon: Maeola Harman, MD;  Location: [facility] INVASIVE CV LAB;  Service: Cardiovascular;  Laterality: N/A;   KNEE ARTHROPLASTY Right 04/01/2023   Procedure: COMPUTER ASSISTED TOTAL KNEE ARTHROPLASTY;  Surgeon: Samson Frederic, MD;  Location: [facility] ORS;  Service: Orthopedics;  Laterality: Right;   NOSE SURGERY     REPLACEMENT TOTAL KNEE Right    WISDOM TOOTH EXTRACTION      Allergies   Allergen Reactions   Sulfa Antibiotics Anaphylaxis   Vancomycin Itching    ROS denies any nausea, vomiting, fever, chills, chest pain, shortness of breath   Physical Exam: There were no vitals filed for this visit.  General: The patient is alert and oriented x3 in no acute distress.  Dermatology: Bilateral first toenails and second, third toenails most notably are brittle at the ends with chalky consistency, portions of yellow discoloration.  No significant nail thickening noted.  Vascular: Palpable pedal pulses bilaterally. Capillary refill within normal limits.  No appreciable edema.  No erythema or calor.  Neurological: Light touch sensation grossly intact bilateral feet.   Musculoskeletal Exam: Left heel pain on palpation to plantar medial tubercle, central aspect and most notably laterally on direct palpation of CC joint and cuboid.  No significant edema.  Crepitus with barefoot gait.  Pain mildly alleviated with repetitive inversion and eversion of the left hindfoot.  Patient states that the pain is constant with any weightbearing and denies typical post static dyskinesia  Radiographic Exam: Left foot 07/05/2023 Normal osseous mineralization.  Joint spaces preserved.  No acute fractures.  Overall rectus foot type.  Some arthritic changes noted to left fourth TMT J.  Bell-shaped cuboid on lateral view  Assessment/Plan of Care: 1. Cuboid syndrome of left foot   2. Onychomycosis  Meds ordered this encounter  Medications   ciclopirox (PENLAC) 8 % solution    Sig: Apply topically at bedtime. Apply over nail and surrounding skin. Apply daily over previous coat. After seven (7) days, may remove with alcohol and continue cycle.    Dispense:  6.6 mL    Refill:  12   meloxicam (MOBIC) 15 MG tablet    Sig: Take 1 tablet (15 mg total) by mouth daily for 21 days.    Dispense:  21 tablet    Refill:  0   AMB REFERRAL TO PHYSICAL THERAPY  Discussed clinical findings with patient  today.  # Onychodystrophy -Nail pathology reviewed with patient positive for onychomycosis mild growth -Patient does have history of fatty liver disease -Will proceed with topical treatment.  Prescription for Penlac sent to patient  # Cuboid syndrome left foot -Radiographs reviewed with patient -Findings seem more consistent with lateral column pain than plantar fasciitis -Referral to physical therapy sent -Starting patient on course of oral meloxicam -Patient would benefit from use of good supportive shoes, work note will be provided to patient -Power steps dispensed to patient today   Corean Yoshimura L. Marchia Bond, AACFAS [facility]     [address] [place], [place] [address]                Office [phone]  Fax [phone]

## 2023-07-09 ENCOUNTER — Ambulatory Visit: Admitting: Internal Medicine

## 2023-07-09 ENCOUNTER — Encounter: Payer: Self-pay | Admitting: Internal Medicine

## 2023-07-09 VITALS — BP 134/82 | HR 87 | Temp 98.2°F | Resp 17 | Ht 64.0 in | Wt 173.0 lb

## 2023-07-09 DIAGNOSIS — L309 Dermatitis, unspecified: Secondary | ICD-10-CM | POA: Diagnosis not present

## 2023-07-09 DIAGNOSIS — Z6829 Body mass index (BMI) 29.0-29.9, adult: Secondary | ICD-10-CM | POA: Diagnosis not present

## 2023-07-09 MED ORDER — PHENTERMINE HCL 37.5 MG PO TABS: 37.5000 mg | ORAL_TABLET | Freq: Every day | ORAL | 0 refills | Status: DC

## 2023-07-09 MED ORDER — TRIAMCINOLONE ACETONIDE 0.1 % EX CREA: 1.0000 | TOPICAL_CREAM | Freq: Two times a day (BID) | CUTANEOUS | 0 refills | Status: DC

## 2023-07-09 NOTE — Assessment & Plan Note (Signed)
 We discussed starting on adipex again.  I will give her 2 months of adipex.  I want her to eat healthy and continue going to the gym and make this a habit.  Our goal will be to lose 3-4 lbs per month while on adipex.

## 2023-07-09 NOTE — Progress Notes (Signed)
 Office Visit  Subjective   Patient ID: Jacqueline Willis   DOB: 06-13-1965   Age: 58 y.o.   MRN: [id number]   Chief Complaint Chief Complaint  Patient presents with   Acute Visit    Rash x 1 month on lower back, and lower stomach     History of Present Illness Jacqueline Willis is a 58 yo female who comes in today for a rash located on her bilateral lower back.  She states this started about a month ago.  She has pruritus with this rash.  She denies any new washing powders, soaps, perfume, or other environmental factors.  She denies any new medications or herbal supplements. She states she cannot see this a rash but she states her husband has seen it and sometimes you can't see a rash and other times the skin is red.  She states it is intensely prutitic and she uses benadryl spray.  She can sometimes get this on her stomach as well.    The patient also wants to talk about weight loss.  She was previously tried on adipex and wegovy.  She loss some weight with adipex at the beginning but then stopped losing weight and we stopped her adipex at the end of last year in 2024.  She had tried wegovy in the past but had side effects with blurred vision.  The patient was exercising up until she had her knee surgery this past year.  She denies any fast food and she denies any soft drinks or sweet tea.  The patient did go for a right TKA on 04/01/2023.  She is finished with outpatient PT for her knee.  She is now going back to the gym for exercise.     Past Medical History Past Medical History:  Diagnosis Date   Arthritis    Chronic venous insufficiency    Complication of anesthesia    problem breathing after colonoscopy   Dercum disease    Fatty liver disease, nonalcoholic    History of DVT (deep vein thrombosis)    History of pulmonary embolus (PE)    History of simple renal cyst    Hypercholesterolemia    OSA (obstructive sleep apnea)    Vitamin D deficiency      Allergies Allergies   Allergen Reactions   Sulfa Antibiotics Anaphylaxis   Vancomycin Itching     Medications  Current Outpatient Medications:    apixaban (ELIQUIS) 2.5 MG TABS tablet, Take 1 tablet (2.5 mg total) by mouth 2 (two) times daily., Disp: 60 tablet, Rfl: 0   aspirin 81 MG chewable tablet, Chew 81 mg by mouth daily., Disp: , Rfl:    ciclopirox (PENLAC) 8 % solution, Apply topically at bedtime. Apply over nail and surrounding skin. Apply daily over previous coat. After seven (7) days, may remove with alcohol and continue cycle., Disp: 6.6 mL, Rfl: 12   meloxicam (MOBIC) 15 MG tablet, Take 1 tablet (15 mg total) by mouth daily for 21 days., Disp: 21 tablet, Rfl: 0   Multiple Vitamin (MULTIVITAMIN) tablet, Take 1 tablet by mouth daily., Disp: , Rfl:    mupirocin ointment (BACTROBAN) 2 %, Place 1 Application into the nose 2 (two) times daily. (Patient taking differently: Place 1 Application into the nose 3 (three) times daily.), Disp: 22 g, Rfl: 0   polyethylene glycol powder (GLYCOLAX/MIRALAX) 17 GM/SCOOP powder, Dissolve 17 g in liquid and take by mouth 2 (two) times daily., Disp: 952 g, Rfl: 0   senna-docusate (  SENOKOT-S) 8.6-50 MG tablet, Take 2 tablets by mouth 2 (two) times daily., Disp: 120 tablet, Rfl: 0   triamcinolone cream (KENALOG) 0.1 %, Apply 1 Application topically 2 (two) times daily., Disp: , Rfl:    Review of Systems Review of Systems  Constitutional:  Negative for chills and fever.  Respiratory:  Negative for cough and shortness of breath.   Cardiovascular:  Negative for chest pain and palpitations.  Gastrointestinal:  Negative for abdominal pain, constipation, diarrhea, nausea and vomiting.  Skin:  Positive for itching. Negative for rash.  Neurological:  Negative for dizziness, weakness and headaches.       Objective:    Vitals BP 134/82   Pulse 87   Temp 98.2 F (36.8 C)   Resp 17   Ht 5\' 4"  (1.626 m)   Wt 173 lb (78.5 kg)   SpO2 99%   BMI 29.70 kg/m    Physical  Examination Physical Exam Constitutional:      Appearance: Normal appearance. She is not ill-appearing.  Cardiovascular:     Rate and Rhythm: Normal rate and regular rhythm.     Pulses: Normal pulses.     Heart sounds: No murmur heard.    No friction rub. No gallop.  Pulmonary:     Effort: Pulmonary effort is normal. No respiratory distress.     Breath sounds: No wheezing, rhonchi or rales.  Abdominal:     General: Bowel sounds are normal. There is no distension.     Palpations: Abdomen is soft.     Tenderness: There is no abdominal tenderness.  Musculoskeletal:     Right lower leg: No edema.     Left lower leg: No edema.  Skin:    General: Skin is warm and dry.     Findings: No rash.  Neurological:     Mental Status: She is alert.        Assessment & Plan:   Eczema She has dry skin with eczema.  I want her to mix 1:1 Eucerin cream with 1% trimacinolone.    BMI 29.0-29.9,adult We discussed starting on adipex again.  I will give her 2 months of adipex.  I want her to eat healthy and continue going to the gym and make this a habit.  Our goal will be to lose 3-4 lbs per month while on adipex.    No follow-ups on file.   Crist Fat, MD

## 2023-07-09 NOTE — Assessment & Plan Note (Signed)
 She has dry skin with eczema.  I want her to mix 1:1 Eucerin cream with 1% trimacinolone.

## 2023-07-16 DIAGNOSIS — Z96651 Presence of right artificial knee joint: Secondary | ICD-10-CM | POA: Diagnosis not present

## 2023-08-02 ENCOUNTER — Ambulatory Visit: Admitting: Podiatry

## 2023-08-02 ENCOUNTER — Ambulatory Visit: Payer: BC Managed Care – PPO | Admitting: Internal Medicine

## 2023-08-09 ENCOUNTER — Ambulatory Visit: Admitting: Internal Medicine

## 2023-08-09 ENCOUNTER — Encounter: Payer: Self-pay | Admitting: Internal Medicine

## 2023-08-09 VITALS — BP 142/78 | HR 74 | Temp 98.2°F | Resp 18 | Ht 64.0 in | Wt 175.8 lb

## 2023-08-09 DIAGNOSIS — R030 Elevated blood-pressure reading, without diagnosis of hypertension: Secondary | ICD-10-CM | POA: Diagnosis not present

## 2023-08-09 DIAGNOSIS — G4733 Obstructive sleep apnea (adult) (pediatric): Secondary | ICD-10-CM

## 2023-08-09 DIAGNOSIS — Z6830 Body mass index (BMI) 30.0-30.9, adult: Secondary | ICD-10-CM | POA: Diagnosis not present

## 2023-08-09 DIAGNOSIS — E66811 Obesity, class 1: Secondary | ICD-10-CM | POA: Insufficient documentation

## 2023-08-09 DIAGNOSIS — E7800 Pure hypercholesterolemia, unspecified: Secondary | ICD-10-CM

## 2023-08-09 NOTE — Progress Notes (Signed)
 Office Visit  Subjective   Patient ID: Jacqueline Willis   DOB: April 29, 1965   Age: 58 y.o.   MRN: [id number]   Chief Complaint Chief Complaint  Patient presents with   Follow-up     History of Present Illness The patient also returns for followup of her weight loss. Last month we did start her back on adipex.  She denies any side effects but believes the medication is making her BP elevated.  Her BP at home has been running 140-150's and diastolic of 90's.   She was previously tried on adipex and wegovy.  She loss some weight with adipex at the beginning but then stopped losing weight and we stopped her adipex at the end of last year in 2024.  She had tried wegovy in the past but had side effects with blurred vision.  The patient was exercising up until she had her knee surgery this past year.  She is now exercising and watching her weight.  She denies any fast food and she denies any soft drinks or sweet tea.  The patient did go for a right TKA on 04/01/2023.  She is now going back to the gym for exercise.  She has been fighting against eating sweets.    The patient also was seen in 04/2023 where we referred her back to sleep medicine due to her history of OSA.  She has a history of obstructive sleep apnea with a home sleep study test in 2022.  She has a CPAP but she stopped using it as she was not getting supplies for this.  She is in need for her CPAP due to her history of OSA with snoring and not resting well.    Jacqueline Willis returns today for routine followup on her cholesterol.  She has been told by multiple doctors that she has fatty liver disease.  She has Dercums disease and we have given her names of specialists to research and let us  know if she would like a referral.   She has ongoing pain, weight issues, painful fatty deposits in her thighs and lower legs, fatigue and weakness which are s/s of Dercums. We have been managing the symptoms.  Overall, she states she is doing well and is  without any complaints or problems at this time. She specifically denies chest pain, abdominal pain, nausea, diarrhea, and myalgias. She remains on dietary management alone for intervention and claims to be adhering well to it thus far. She is fasting in anticipation for labs today.      Past Medical History Past Medical History:  Diagnosis Date   Arthritis    Chronic venous insufficiency    Complication of anesthesia    problem breathing after colonoscopy   Dercum disease    Fatty liver disease, nonalcoholic    History of DVT (deep vein thrombosis)    History of pulmonary embolus (PE)    History of simple renal cyst    Hypercholesterolemia    OSA (obstructive sleep apnea)    Vitamin D deficiency      Allergies Allergies  Allergen Reactions   Sulfa Antibiotics Anaphylaxis   Vancomycin Itching     Medications  Current Outpatient Medications:    apixaban (ELIQUIS) 2.5 MG TABS tablet, Take 1 tablet (2.5 mg total) by mouth 2 (two) times daily., Disp: 60 tablet, Rfl: 0   aspirin 81 MG chewable tablet, Chew 81 mg by mouth daily., Disp: , Rfl:    ciclopirox (PENLAC) 8 % solution,  Apply topically at bedtime. Apply over nail and surrounding skin. Apply daily over previous coat. After seven (7) days, may remove with alcohol and continue cycle., Disp: 6.6 mL, Rfl: 12   Multiple Vitamin (MULTIVITAMIN) tablet, Take 1 tablet by mouth daily., Disp: , Rfl:    mupirocin ointment (BACTROBAN) 2 %, Place 1 Application into the nose 2 (two) times daily. (Patient taking differently: Place 1 Application into the nose 3 (three) times daily.), Disp: 22 g, Rfl: 0   polyethylene glycol powder (GLYCOLAX/MIRALAX) 17 GM/SCOOP powder, Dissolve 17 g in liquid and take by mouth 2 (two) times daily., Disp: 952 g, Rfl: 0   senna-docusate (SENOKOT-S) 8.6-50 MG tablet, Take 2 tablets by mouth 2 (two) times daily., Disp: 120 tablet, Rfl: 0   triamcinolone cream (KENALOG) 0.1 %, Apply 1 Application topically 2 (two)  times daily. Mix 1:1 with eucerin cream, Disp: 80 g, Rfl: 0   Review of Systems Review of Systems  Constitutional:  Negative for chills and fever.  Eyes:  Negative for blurred vision and double vision.  Respiratory:  Negative for cough and shortness of breath.   Cardiovascular:  Negative for chest pain, palpitations and leg swelling.       Mild ankle swelling  Gastrointestinal:  Negative for abdominal pain, constipation, diarrhea, heartburn, nausea and vomiting.  Genitourinary:  Negative for frequency.  Musculoskeletal:  Negative for myalgias.  Skin:  Positive for itching and rash.  Neurological:  Negative for dizziness, weakness and headaches.  Endo/Heme/Allergies:  Negative for polydipsia.       Objective:    Vitals BP (!) 142/78   Pulse 74   Temp 98.2 F (36.8 C)   Resp 18   Ht 5\' 4"  (1.626 m)   Wt 175 lb 12.8 oz (79.7 kg)   SpO2 98%   BMI 30.18 kg/m    Physical Examination Physical Exam Constitutional:      Appearance: Normal appearance. She is not ill-appearing.  Cardiovascular:     Rate and Rhythm: Normal rate and regular rhythm.     Pulses: Normal pulses.     Heart sounds: No murmur heard.    No friction rub. No gallop.  Pulmonary:     Effort: Pulmonary effort is normal. No respiratory distress.     Breath sounds: No wheezing, rhonchi or rales.  Abdominal:     General: Bowel sounds are normal. There is no distension.     Palpations: Abdomen is soft.     Tenderness: There is no abdominal tenderness.  Musculoskeletal:     Right lower leg: No edema.     Left lower leg: No edema.  Skin:    General: Skin is warm and dry.     Findings: No rash.  Neurological:     General: No focal deficit present.     Mental Status: She is alert and oriented to person, place, and time.  Psychiatric:        Mood and Affect: Mood normal.        Behavior: Behavior normal.        Assessment & Plan:   OSA (obstructive sleep apnea) The patient requires CPAP due to her  history of obesity and OSA.  She needs to be seen by pulmonary medicine and reevaluated for CPAP/BIPAP.  Elevated BP without diagnosis of hypertension Her BP is elevated and this is the second time she had this in the past year.  I think her BP is elevated due to her adipex use.  We  will discontinue it as she has not been losing weight.  BMI 30.0-30.9,adult Plan as above.  Class 1 obesity due to excess calories with serious comorbidity and body mass index (BMI) of 30.0 to 30.9 in adult She has obesity with risk factors of HCL, OSA and fatty liver disease.  I am going to stop the adipex.  She cannot take wegovy as she had side effects of blurred vision before.  We discussed zepbound and she wants to hold on this.  I asked her to read about it.  I want her to continue to exercise regularly, eat healthy and lose weight.  Hypercholesterolemia We will recheck her FLP today.   Continue with weight loss measures.    Return in about 3 months (around 11/08/2023) for annual.   Wayne Haines, MD

## 2023-08-09 NOTE — Assessment & Plan Note (Signed)
 The patient requires CPAP due to her history of obesity and OSA.  She needs to be seen by pulmonary medicine and reevaluated for CPAP/BIPAP.

## 2023-08-09 NOTE — Assessment & Plan Note (Signed)
 We will recheck her FLP today.   Continue with weight loss measures.

## 2023-08-09 NOTE — Assessment & Plan Note (Signed)
 Her BP is elevated and this is the second time she had this in the past year.  I think her BP is elevated due to her adipex use.  We will discontinue it as she has not been losing weight.

## 2023-08-09 NOTE — Assessment & Plan Note (Signed)
 Plan as above.

## 2023-08-09 NOTE — Assessment & Plan Note (Signed)
 She has obesity with risk factors of HCL, OSA and fatty liver disease.  I am going to stop the adipex.  She cannot take wegovy as she had side effects of blurred vision before.  We discussed zepbound and she wants to hold on this.  I asked her to read about it.  I want her to continue to exercise regularly, eat healthy and lose weight.

## 2023-08-10 LAB — CMP14 + ANION GAP
ALT: 21 IU/L (ref 0–32)
AST: 20 IU/L (ref 0–40)
Albumin: 4.3 g/dL (ref 3.8–4.9)
Alkaline Phosphatase: 80 IU/L (ref 44–121)
Anion Gap: 11 mmol/L (ref 10.0–18.0)
BUN/Creatinine Ratio: 25 — ABNORMAL HIGH (ref 9–23)
BUN: 15 mg/dL (ref 6–24)
Bilirubin Total: 0.4 mg/dL (ref 0.0–1.2)
CO2: 27 mmol/L (ref 20–29)
Calcium: 9.3 mg/dL (ref 8.7–10.2)
Chloride: 104 mmol/L (ref 96–106)
Creatinine, Ser: 0.61 mg/dL (ref 0.57–1.00)
Globulin, Total: 2.7 g/dL (ref 1.5–4.5)
Glucose: 101 mg/dL — ABNORMAL HIGH (ref 70–99)
Potassium: 4 mmol/L (ref 3.5–5.2)
Sodium: 142 mmol/L (ref 134–144)
Total Protein: 7 g/dL (ref 6.0–8.5)
eGFR: 104 mL/min/1.73

## 2023-08-10 LAB — LIPID PANEL
Chol/HDL Ratio: 4.8 ratio — ABNORMAL HIGH (ref 0.0–4.4)
Cholesterol, Total: 241 mg/dL — ABNORMAL HIGH (ref 100–199)
HDL: 50 mg/dL
LDL Chol Calc (NIH): 141 mg/dL — ABNORMAL HIGH (ref 0–99)
Triglycerides: 274 mg/dL — ABNORMAL HIGH (ref 0–149)
VLDL Cholesterol Cal: 50 mg/dL — ABNORMAL HIGH (ref 5–40)

## 2023-08-16 NOTE — Progress Notes (Signed)
 Her cholesterol is elevated. Cut back fats in diet and exercise more.  Patient is aware of labs

## 2023-08-27 DIAGNOSIS — R5383 Other fatigue: Secondary | ICD-10-CM | POA: Diagnosis not present

## 2023-08-27 DIAGNOSIS — G4733 Obstructive sleep apnea (adult) (pediatric): Secondary | ICD-10-CM | POA: Diagnosis not present

## 2023-08-31 DIAGNOSIS — J4520 Mild intermittent asthma, uncomplicated: Secondary | ICD-10-CM | POA: Diagnosis not present

## 2023-08-31 DIAGNOSIS — R400 Somnolence: Secondary | ICD-10-CM | POA: Diagnosis not present

## 2023-08-31 DIAGNOSIS — G4761 Periodic limb movement disorder: Secondary | ICD-10-CM | POA: Diagnosis not present

## 2023-08-31 DIAGNOSIS — G4733 Obstructive sleep apnea (adult) (pediatric): Secondary | ICD-10-CM | POA: Diagnosis not present

## 2023-09-24 DIAGNOSIS — G4733 Obstructive sleep apnea (adult) (pediatric): Secondary | ICD-10-CM | POA: Diagnosis not present

## 2023-10-04 ENCOUNTER — Ambulatory Visit: Admitting: Internal Medicine

## 2023-10-04 ENCOUNTER — Encounter: Payer: Self-pay | Admitting: Internal Medicine

## 2023-10-04 VITALS — BP 122/80 | HR 92 | Temp 97.6°F | Resp 18 | Ht 64.0 in | Wt 177.6 lb

## 2023-10-04 DIAGNOSIS — E6609 Other obesity due to excess calories: Secondary | ICD-10-CM

## 2023-10-04 DIAGNOSIS — G4733 Obstructive sleep apnea (adult) (pediatric): Secondary | ICD-10-CM | POA: Diagnosis not present

## 2023-10-04 DIAGNOSIS — Z6830 Body mass index (BMI) 30.0-30.9, adult: Secondary | ICD-10-CM

## 2023-10-04 DIAGNOSIS — E66811 Obesity, class 1: Secondary | ICD-10-CM

## 2023-10-04 MED ORDER — ZEPBOUND 2.5 MG/0.5ML ~~LOC~~ SOAJ: 2.5000 mg | SUBCUTANEOUS | 0 refills | Status: DC

## 2023-10-04 MED ORDER — ZEPBOUND 7.5 MG/0.5ML ~~LOC~~ SOAJ: 7.5000 mg | SUBCUTANEOUS | 0 refills | Status: DC

## 2023-10-04 MED ORDER — ZEPBOUND 5 MG/0.5ML ~~LOC~~ SOAJ: 5.0000 mg | SUBCUTANEOUS | 0 refills | Status: DC

## 2023-10-04 NOTE — Assessment & Plan Note (Signed)
 We will continue with weight loss measures.  She goes to see Dr. Arita Belch in a few weeks and he is going to put her on a CPAP.

## 2023-10-04 NOTE — Assessment & Plan Note (Signed)
 Plan as above.

## 2023-10-04 NOTE — Progress Notes (Signed)
 Office Visit  Subjective   Patient ID: Jacqueline Willis   DOB: Jul 08, 1965   Age: 58 y.o.   MRN: [id number]   Chief Complaint Chief Complaint  Patient presents with   Weight Loss    Weight loss     History of Present Illness The patient also returns for followup of her weight loss management.  She has gained 2 lbs over the last 2 months.  She is currently not on any weight loss medications at this time.  She is interested in zepbound.  She is still exercising where she uses a stationery bike 1 hour 3-4 hours.  She has been on adipex in the past without side effects.   She was previously tried on [employer].  She loss some weight with adipex at the beginning but then stopped losing weight and we stopped her adipex at the end of last year in 2024.  She had tried wegovy in the past but had side effects with blurred vision.  The patient was exercising up until she had her knee surgery this past year.  She is exercising as described and she is eating healthy. She has been having sugar cravings. She denies any fast food and she denies any soft drinks or sweet tea.    She also follows up for her OSA.  She saw me this past year where she wanted a referral back to sleep medicine due to her history of OSA.  She has a history of obstructive sleep apnea with a home sleep study test in 2022.  She has a CPAP but she stopped using it as she was not getting supplies for this.  I referred her to Dr. Arita Belch where he has repeated a sleep study on her and she also went for her titration study.  She goes to followup with Dr. Arita Belch on 10/14/2023.  She is not wearing a CPAP yet.      Past Medical History Past Medical History:  Diagnosis Date   Arthritis    Chronic venous insufficiency    Complication of anesthesia    problem breathing after colonoscopy   Dercum disease    Fatty liver disease, nonalcoholic    History of DVT (deep vein thrombosis)    History of pulmonary embolus (PE)    History of simple  renal cyst    Hypercholesterolemia    OSA (obstructive sleep apnea)    Vitamin D deficiency      Allergies Allergies  Allergen Reactions   Sulfa Antibiotics Anaphylaxis   Vancomycin Itching     Medications  Current Outpatient Medications:    aspirin  81 MG chewable tablet, Chew 81 mg by mouth daily., Disp: , Rfl:    Multiple Vitamin (MULTIVITAMIN) tablet, Take 1 tablet by mouth daily., Disp: , Rfl:    polyethylene glycol powder (GLYCOLAX /MIRALAX ) 17 GM/SCOOP powder, Dissolve 17 g in liquid and take by mouth 2 (two) times daily., Disp: 952 g, Rfl: 0   Review of Systems Review of Systems  Constitutional:  Negative for chills and fever.  Respiratory:  Negative for shortness of breath.   Cardiovascular:  Negative for chest pain, palpitations and leg swelling.  Gastrointestinal:  Negative for abdominal pain, constipation, diarrhea, nausea and vomiting.  Neurological:  Negative for dizziness, weakness and headaches.       Objective:    Vitals BP 122/80   Pulse 92   Temp 97.6 F (36.4 C) (Temporal)   Resp 18   Ht 5\' 4"  (1.626 m)  Wt 177 lb 9.6 oz (80.6 kg)   SpO2 98%   BMI 30.48 kg/m    Physical Examination Physical Exam Constitutional:      Appearance: Normal appearance. She is not ill-appearing.  Cardiovascular:     Rate and Rhythm: Normal rate and regular rhythm.     Pulses: Normal pulses.     Heart sounds: No murmur heard.    No friction rub. No gallop.  Pulmonary:     Effort: Pulmonary effort is normal. No respiratory distress.     Breath sounds: No wheezing, rhonchi or rales.  Abdominal:     General: Bowel sounds are normal. There is no distension.     Palpations: Abdomen is soft.     Tenderness: There is no abdominal tenderness.  Musculoskeletal:     Right lower leg: No edema.     Left lower leg: No edema.  Skin:    General: Skin is warm and dry.     Findings: No rash.  Neurological:     Mental Status: She is alert.        Assessment & Plan:    OSA (obstructive sleep apnea) We will continue with weight loss measures.  She goes to see Dr. Arita Belch in a few weeks and he is going to put her on a CPAP.  BMI 30.0-30.9,adult She has obesity where I want to start her on zepbound. She is doing a great job with exercise and she will cotninue to eat healthy and make healthy decisions. We will start her on zebound for weight loss and uptitrate her.    Class 1 obesity due to excess calories with serious comorbidity and body mass index (BMI) of 30.0 to 30.9 in adult Plan as above.    Return in about 3 months (around 01/04/2024) for annual.   Wayne Haines, MD

## 2023-10-04 NOTE — Assessment & Plan Note (Signed)
 She has obesity where I want to start her on zepbound. She is doing a great job with exercise and she will cotninue to eat healthy and make healthy decisions. We will start her on zebound for weight loss and uptitrate her.

## 2023-10-12 DIAGNOSIS — M7051 Other bursitis of knee, right knee: Secondary | ICD-10-CM | POA: Diagnosis not present

## 2023-10-12 DIAGNOSIS — Z96651 Presence of right artificial knee joint: Secondary | ICD-10-CM | POA: Diagnosis not present

## 2023-10-12 DIAGNOSIS — M1711 Unilateral primary osteoarthritis, right knee: Secondary | ICD-10-CM | POA: Diagnosis not present

## 2023-11-01 ENCOUNTER — Encounter: Payer: Self-pay | Admitting: Internal Medicine

## 2023-11-01 DIAGNOSIS — R928 Other abnormal and inconclusive findings on diagnostic imaging of breast: Secondary | ICD-10-CM | POA: Diagnosis not present

## 2023-11-01 DIAGNOSIS — Z1231 Encounter for screening mammogram for malignant neoplasm of breast: Secondary | ICD-10-CM | POA: Diagnosis not present

## 2023-11-05 DIAGNOSIS — M25561 Pain in right knee: Secondary | ICD-10-CM | POA: Diagnosis not present

## 2023-11-05 DIAGNOSIS — G8929 Other chronic pain: Secondary | ICD-10-CM | POA: Diagnosis not present

## 2023-11-05 DIAGNOSIS — R29898 Other symptoms and signs involving the musculoskeletal system: Secondary | ICD-10-CM | POA: Diagnosis not present

## 2023-11-05 DIAGNOSIS — Z96651 Presence of right artificial knee joint: Secondary | ICD-10-CM | POA: Diagnosis not present

## 2023-11-08 ENCOUNTER — Encounter: Payer: Self-pay | Admitting: Internal Medicine

## 2023-11-08 ENCOUNTER — Ambulatory Visit: Admitting: Internal Medicine

## 2023-11-08 VITALS — BP 140/80 | HR 92 | Temp 97.7°F | Resp 18 | Ht 64.0 in | Wt 176.4 lb

## 2023-11-08 DIAGNOSIS — M545 Low back pain, unspecified: Secondary | ICD-10-CM

## 2023-11-08 DIAGNOSIS — E7800 Pure hypercholesterolemia, unspecified: Secondary | ICD-10-CM | POA: Diagnosis not present

## 2023-11-08 DIAGNOSIS — Z86711 Personal history of pulmonary embolism: Secondary | ICD-10-CM

## 2023-11-08 DIAGNOSIS — K760 Fatty (change of) liver, not elsewhere classified: Secondary | ICD-10-CM

## 2023-11-08 DIAGNOSIS — Z6830 Body mass index (BMI) 30.0-30.9, adult: Secondary | ICD-10-CM

## 2023-11-08 DIAGNOSIS — I872 Venous insufficiency (chronic) (peripheral): Secondary | ICD-10-CM

## 2023-11-08 DIAGNOSIS — Z86718 Personal history of other venous thrombosis and embolism: Secondary | ICD-10-CM

## 2023-11-08 DIAGNOSIS — K219 Gastro-esophageal reflux disease without esophagitis: Secondary | ICD-10-CM

## 2023-11-08 DIAGNOSIS — G4733 Obstructive sleep apnea (adult) (pediatric): Secondary | ICD-10-CM

## 2023-11-08 DIAGNOSIS — M1712 Unilateral primary osteoarthritis, left knee: Secondary | ICD-10-CM | POA: Insufficient documentation

## 2023-11-08 DIAGNOSIS — R030 Elevated blood-pressure reading, without diagnosis of hypertension: Secondary | ICD-10-CM

## 2023-11-08 DIAGNOSIS — E66811 Obesity, class 1: Secondary | ICD-10-CM

## 2023-11-08 DIAGNOSIS — Z0000 Encounter for general adult medical examination without abnormal findings: Secondary | ICD-10-CM

## 2023-11-08 NOTE — Assessment & Plan Note (Signed)
 Health maintenance was discussed.  We will refer her for colonoscopy at this time.  We will obtain some yearly labs.

## 2023-11-08 NOTE — Assessment & Plan Note (Signed)
 She has not having edema of her feet today.  She is to avoid extra salt and use compression hose as needed.

## 2023-11-08 NOTE — Assessment & Plan Note (Signed)
This is not a problem today.

## 2023-11-08 NOTE — Assessment & Plan Note (Signed)
 She has an IVC filter removed in 05/2023.

## 2023-11-08 NOTE — Assessment & Plan Note (Signed)
She can use tylenol as needed for pain.

## 2023-11-08 NOTE — Assessment & Plan Note (Signed)
We will check her FLP today. 

## 2023-11-08 NOTE — Assessment & Plan Note (Signed)
 I want her to lose weight, eat healthy and exercise.

## 2023-11-08 NOTE — Assessment & Plan Note (Signed)
 This is not a problem at this time.  We will continue to monitor.

## 2023-11-08 NOTE — Progress Notes (Signed)
 Office Visit  Subjective   Patient ID: Jacqueline Willis   DOB: Aug 07, 1965   Age: 58 y.o.   MRN: [id number]   Chief Complaint Chief Complaint  Patient presents with   Annual Exam     History of Present Illness Jacqueline Willis is a 58 year old Caucasian/White female who presents for her annual health maintenance exam. She is due for the following health maintenance studies: mammogram and screening labs. This patient's past medical history Chronic Venous Insufficiency, Deep Vein Thrombosis, Elevated Cholesterol, Embolism, Fatty Liver Disease, Osteoarthritis, Sleep Apnea, Obstructive, Varicose Veins, and Vitamin D deficiency.    Her last eye exam was done around 05/2023 and she states her vision is doing well.  There is a family history of colorectal cancer in her father but no family history of breast cancer.  Her last colonoscopy was done in [place]  in 2020 and she states it was normal and she was told to come back in 5 years.  Her last digital screening mammogram was done on 11/01/2023 and this showed retropectoral implants and no findings suspicious for malignancy.  She had a partial hysterectomy in 1998.  She has never smoked.  She does exercise regularly by using an exercise bike.  The patient does not get yearly flu vaccines.  In 2020, she was given the flu vaccine and pneumovax 23 at same time and she ended up passing out and vomiting.  They do not know which shot caused this reaction but she was in the hospital for 3 days.  The patient has never had a shingles vaccine.  The patient has had 3 COVID-19 vaccines including 1 booster.  There is a family of MI in her grandmother in her 85's and a stroke in the same grandmother in her 29's.  Her paternal grandfather had a MI in his 12's as well.  The patient is on an ASA 81mg  daily.  The patient also returns for followup of her weight loss management.  Her insurance will not cover zebound at this time.  She is currently not on any weight loss  medications at this time.  SShe has been on adipex in the past without side effects.   She was previously tried on [employer].  She loss some weight with adipex at the beginning but then stopped losing weight and we stopped her adipex at the end of last year in 2024.  She had tried wegovy in the past but had side effects with blurred vision.  The patient was exercising up until she had her knee surgery this past year.  She is exercising as described and she is eating healthy. She has been having sugar cravings. She denies any fast food and she denies any soft drinks or sweet tea.     She also follows up for her OSA.  She saw me this past year where she wanted a referral back to sleep medicine due to her history of OSA.  She has a history of obstructive sleep apnea with a home sleep study test in 2022.  She has a CPAP but she stopped using it as she was not getting supplies for this.  I referred her to Jacqueline Willis where he has repeated a sleep study on her and she also went for her titration study.  She has followup with Jacqueline Willis on 11/16/2023 and that when she gets her CPAP machine.    Jacqueline Willis returns today for routine followup on her cholesterol.  She has been told by  multiple doctors that she has fatty liver disease.  She has Dercums disease and we have given her names of specialists to research and let us  know if she would like a referral.   She has ongoing pain, weight issues, painful fatty deposits in her thighs and lower legs, fatigue and weakness which are s/s of Dercums. We have been managing the symptoms.  Overall, she states she is doing well and is without any complaints or problems at this time. She specifically denies chest pain, abdominal pain, nausea, diarrhea, and myalgias. She remains on dietary management alone for intervention and claims to be adhering well to it thus far. She is fasting in anticipation for labs today.   She has a history of elevated BP without diagnosis of  hypertension.  Her last BP in 07/2023 was elevated.    The patient also has a history of DVT and PE.  Her prior history is significant for unprovoked pulmonary emboli in 2004. There was no evidence of DVT at that time. She was hospitalized one week on heparin  and transitioned to coumadin. She took coumadin for about a year.  She is no longer on chronic anticoagulation. Hematology work up was negative for coagulopathy.  She also has a history of LLE DVT while pregnant in 1997. 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 She had a IVC filter removed on 06/21/2023.    The patient is a 58 yo female who returns today for followup of her osteoarthritis.  She had a right TKA in 03/2023.  She states she is having some arthritis now in her left knee.  .  The patient has osteoarthritis of her right knee that she states she began having pain and swelling about 7-8 years ago.  This can be a  severe aching, throbbing, and sharp right knee pain.   She has been seen by Jacqueline Willis who has done steriod injections in the past.  They gel injections of this knee in 10/2022 and this did not help.        The patient was hospitalized in 07/2022 due to acute chest and abdominal pain.    We did lab testing in 07/2022 and her ASCVD score was 3.3%.  Her troponins were negative and the hospitalists felt her symptoms were due to a GI cause.  She underwent a CTA of the chest, abd/pelvis and this was unremarkable except for some irregular plaques in her SMA and celiac arteries without any high grade stenosis.  She underwent an ECHO which showed a normal LVEF of 60-65% and her LV diastolic function was undetermined. She also underwent stress testing which showed no reversible ischemia and a preserved EF.  They suspected her symptoms were GI related.       The patient also has a history of low back that started about 5 years ago.  The onset was not associated with any specific event. She states she has not had any  problems wth lower back pain for a while.  She denies weakness, changes in sensation in the legs, and bladder/bowel dysfunction. She has been previously treated with NSAIDs and pain medication.She had an MRI of her spine on 08/23/2021 which indicated L4-L5 moderate to severe facet arthropathy, mild neural foraminal narrowing and narrowing in the lateral recess which could be affecting the descending L5 nerve roots, and L5-S1 mild left neural foraminal narrowing.  She states that since losing weight, her back pain is improved and not really effecting her today.  She was sent to see hematology in 08/2021 where her lumbar spine MRI  suggested there may be hematologic reasons behind the changes within this region.  TThe results of the study revealed a decreased marrow signal.  Although nonspecific, it was thought that such a finding could potentially be seen when anemia or some type of infiltrative marrow process is present.  The patient does have a history of blood clots, but denies having other hematologic issues which have ever alerted her to an underlying blood disorder being present.   They felt her marrow changes on MRI are were too nonspecific to formulate any type of diagnosis that an underlying hematologic disorder is present.  There was  nothing per her CBC which suggested a bone marrow biopsy needs to be done for further evaluation.  She states the lower back pain was also associated with radiation down her left leg and also had right hip pain that could be severe.  She had a MRI of the hip which showed mild to moderate bilateral femoroacetabular cartilage thinning and moderate right gluteus medius insertional tendinosis.     The patient is a 58 yo with a history of chronic venous insufficiency.  She used to see the vascular/vein clinic where she has had bilateral lower extremity pain and varicosities and swelling.  She has chronic venous insufficiency with CEAP C1 venous disease (telangiectasias/reticular  veins).  She does have significant symptoms of venous hypertension.  She is supposed to wear compression hose and they have discussed the importance of intermittent leg elevation.   They have told her that  venous disease tends to be progressive and that if her symptoms worsen in the future or she develops large truncal varicosities, they would need to repeat her venous reflux study to see if things have progressed.  She is supposed to wear compression hose but she does not do this.   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  They recommended hearing protection and regular hearing tests every 1-2 years.   She has a history of obstructive sleep apnea with a home sleep study test in 2022.  She has a CPAP but she stopped using it as she was not getting supplies for this.          Past Medical History Past Medical History:  Diagnosis Date   Arthritis    Chronic venous insufficiency    Complication of anesthesia    problem breathing after colonoscopy   Dercum disease    Fatty liver disease, nonalcoholic    History of DVT (deep vein thrombosis)    History of pulmonary embolus (PE)    History of simple renal cyst    Hypercholesterolemia    OSA (obstructive sleep apnea)    Vitamin D deficiency      Allergies Allergies  Allergen Reactions   Sulfa Antibiotics Anaphylaxis   Vancomycin Itching     Medications  Current Outpatient Medications:    aspirin  81 MG chewable tablet, Chew 81 mg by mouth daily., Disp: , Rfl:    Multiple Vitamin (MULTIVITAMIN) tablet, Take 1 tablet by mouth daily., Disp: , Rfl:    polyethylene glycol powder (GLYCOLAX /MIRALAX ) 17 GM/SCOOP powder, Dissolve 17 g in liquid and take by mouth 2 (two) times daily., Disp: 952 g, Rfl: 0   Review of Systems Review of Systems  Constitutional:  Positive  for malaise/fatigue. Negative for chills  and fever.  Eyes:  Negative for blurred vision and double vision.  Respiratory:  Negative for cough, hemoptysis, shortness of breath and wheezing.   Cardiovascular:  Negative for chest pain, palpitations and leg swelling.  Gastrointestinal:  Negative for abdominal pain, blood in stool, constipation, diarrhea, heartburn, melena, nausea and vomiting.  Genitourinary:  Negative for frequency and hematuria.  Musculoskeletal:  Positive for myalgias.  Skin:  Negative for itching and rash.  Neurological:  Negative for dizziness, weakness and headaches.  Endo/Heme/Allergies:  Negative for polydipsia.       Objective:    Vitals BP (!) 140/80   Pulse 92   Temp 97.7 F (36.5 C)   Resp 18   Ht 5' 4 (1.626 m)   Wt 176 lb 6 oz (80 kg)   SpO2 98%   BMI 30.27 kg/m    Physical Examination Physical Exam Constitutional:      Appearance: Normal appearance. She is not ill-appearing.  HENT:     Head: Normocephalic and atraumatic.     Right Ear: Tympanic membrane, ear canal and external ear normal.     Left Ear: Tympanic membrane, ear canal and external ear normal.     Nose: Nose normal. No congestion or rhinorrhea.     Mouth/Throat:     Mouth: Mucous membranes are moist.     Pharynx: Oropharynx is clear. No oropharyngeal exudate.  Eyes:     General: No scleral icterus.       Right eye: No discharge.        Left eye: No discharge.     Extraocular Movements: Extraocular movements intact.     Pupils: Pupils are equal, round, and reactive to light.  Neck:     Vascular: No carotid bruit.  Cardiovascular:     Rate and Rhythm: Normal rate and regular rhythm.     Pulses: Normal pulses.     Heart sounds: No murmur heard.    No friction rub. No gallop.  Pulmonary:     Effort: Pulmonary effort is normal. No respiratory distress.     Breath sounds: No wheezing, rhonchi or rales.  Abdominal:     General: Bowel sounds are normal. There is no distension.     Palpations: Abdomen is soft.      Tenderness: There is no abdominal tenderness.  Musculoskeletal:     Cervical back: Neck supple. No tenderness.     Right lower leg: No edema.     Left lower leg: No edema.  Lymphadenopathy:     Cervical: No cervical adenopathy.  Skin:    General: Skin is warm and dry.     Findings: No rash.  Neurological:     General: No focal deficit present.     Mental Status: She is alert and oriented to person, place, and time.  Psychiatric:        Mood and Affect: Mood normal.        Behavior: Behavior normal.        Assessment & Plan:   Chronic venous insufficiency She has not having edema of her feet today.  She is to avoid extra salt and use compression hose as needed.  OSA (obstructive sleep apnea) She goes to followup with Jacqueline Willis this month and will be started on CPAP.  Fatty liver disease, nonalcoholic I want her to lose weight, eat healthy and exercise.  Gastroesophageal reflux disease She denies any problems with reflux today.  Primary osteoarthritis of  left knee She can use tylenol  as needed for pain.  Annual physical exam Health maintenance was discussed.  We will refer her for colonoscopy at this time.  We will obtain some yearly labs.  BMI 30.0-30.9,adult I will try to get some samples of mounjaro.  We will try her on adipex on her next visit.  Class 1 obesity due to excess calories with serious comorbidity and body mass index (BMI) of 30.0 to 30.9 in adult Plan as above.  Elevated BP without diagnosis of hypertension We will continue to monitor her BP.  History of DVT (deep vein thrombosis) This is not a problem today.  History of pulmonary embolus (PE) She has an IVC filter removed in 05/2023.  Hypercholesterolemia We will check her FLP today.  Lumbar back pain This is not a problem at this time.  We will continue to monitor.    Return in about 3 months (around 02/08/2024).   Jacqueline Fleeta Finger, MD

## 2023-11-08 NOTE — Assessment & Plan Note (Signed)
We will continue to monitor her BP.

## 2023-11-08 NOTE — Assessment & Plan Note (Signed)
 She goes to followup with Dr. Beulah this month and will be started on CPAP.

## 2023-11-08 NOTE — Assessment & Plan Note (Signed)
 I will try to get some samples of mounjaro.  We will try her on adipex on her next visit.

## 2023-11-08 NOTE — Assessment & Plan Note (Signed)
 Plan as above.

## 2023-11-08 NOTE — Assessment & Plan Note (Signed)
 She denies any problems with reflux today.

## 2023-11-09 DIAGNOSIS — Z96651 Presence of right artificial knee joint: Secondary | ICD-10-CM | POA: Diagnosis not present

## 2023-11-09 DIAGNOSIS — G8929 Other chronic pain: Secondary | ICD-10-CM | POA: Diagnosis not present

## 2023-11-09 DIAGNOSIS — R29898 Other symptoms and signs involving the musculoskeletal system: Secondary | ICD-10-CM | POA: Diagnosis not present

## 2023-11-09 DIAGNOSIS — M25561 Pain in right knee: Secondary | ICD-10-CM | POA: Diagnosis not present

## 2023-11-09 LAB — CBC WITH DIFFERENTIAL/PLATELET
Basophils Absolute: 0.1 x10E3/uL (ref 0.0–0.2)
Basos: 1 %
EOS (ABSOLUTE): 0.1 x10E3/uL (ref 0.0–0.4)
Eos: 3 %
Hematocrit: 44.6 % (ref 34.0–46.6)
Hemoglobin: 14.4 g/dL (ref 11.1–15.9)
Immature Grans (Abs): 0 x10E3/uL (ref 0.0–0.1)
Immature Granulocytes: 0 %
Lymphocytes Absolute: 1.6 x10E3/uL (ref 0.7–3.1)
Lymphs: 34 %
MCH: 31.2 pg (ref 26.6–33.0)
MCHC: 32.3 g/dL (ref 31.5–35.7)
MCV: 97 fL (ref 79–97)
Monocytes Absolute: 0.5 x10E3/uL (ref 0.1–0.9)
Monocytes: 10 %
Neutrophils Absolute: 2.5 x10E3/uL (ref 1.4–7.0)
Neutrophils: 52 %
Platelets: 188 x10E3/uL (ref 150–450)
RBC: 4.62 x10E6/uL (ref 3.77–5.28)
RDW: 12.1 % (ref 11.7–15.4)
WBC: 4.7 x10E3/uL (ref 3.4–10.8)

## 2023-11-09 LAB — CMP14 + ANION GAP
ALT: 18 IU/L (ref 0–32)
AST: 20 IU/L (ref 0–40)
Albumin: 4.2 g/dL (ref 3.8–4.9)
Alkaline Phosphatase: 71 IU/L (ref 44–121)
Anion Gap: 13 mmol/L (ref 10.0–18.0)
BUN/Creatinine Ratio: 18 (ref 9–23)
BUN: 13 mg/dL (ref 6–24)
Bilirubin Total: 0.3 mg/dL (ref 0.0–1.2)
CO2: 26 mmol/L (ref 20–29)
Calcium: 9.5 mg/dL (ref 8.7–10.2)
Chloride: 102 mmol/L (ref 96–106)
Creatinine, Ser: 0.73 mg/dL (ref 0.57–1.00)
Globulin, Total: 3 g/dL (ref 1.5–4.5)
Glucose: 113 mg/dL — ABNORMAL HIGH (ref 70–99)
Potassium: 4.2 mmol/L (ref 3.5–5.2)
Sodium: 141 mmol/L (ref 134–144)
Total Protein: 7.2 g/dL (ref 6.0–8.5)
eGFR: 95 mL/min/1.73 (ref >59)

## 2023-11-09 LAB — TSH: TSH: 1.33 u[IU]/mL (ref 0.450–4.500)

## 2023-11-09 LAB — LIPID PANEL
Chol/HDL Ratio: 3.9 ratio (ref 0.0–4.4)
Cholesterol, Total: 236 mg/dL — ABNORMAL HIGH (ref 100–199)
HDL: 60 mg/dL (ref >39)
LDL Chol Calc (NIH): 148 mg/dL — ABNORMAL HIGH (ref 0–99)
Triglycerides: 159 mg/dL — ABNORMAL HIGH (ref 0–149)
VLDL Cholesterol Cal: 28 mg/dL (ref 5–40)

## 2023-11-09 LAB — HEMOGLOBIN A1C
Est. average glucose Bld gHb Est-mCnc: 117 mg/dL
Hgb A1c MFr Bld: 5.7 % — ABNORMAL HIGH (ref 4.8–5.6)

## 2023-11-10 ENCOUNTER — Ambulatory Visit: Payer: Self-pay

## 2023-11-10 NOTE — Progress Notes (Signed)
 Patient called.  Patient aware.  I have called and left the patient a voicemail to return our phone call. The patient needs to be informed Her labs look good.  Her cholesterol is a bit elevated but her ASCVD score is 3.8%.  I want her to watch her diet and exercise. SABRA

## 2023-11-11 NOTE — Progress Notes (Signed)
 Patient called.  Patient aware.  I have called and informed the patient  Her labs look good.  Her cholesterol is a bit elevated but her ASCVD score is 3.8%.  I want her to watch her diet and exercise. Jacqueline Willis  Pt aware.

## 2023-11-16 DIAGNOSIS — J4520 Mild intermittent asthma, uncomplicated: Secondary | ICD-10-CM | POA: Diagnosis not present

## 2023-11-16 DIAGNOSIS — R400 Somnolence: Secondary | ICD-10-CM | POA: Diagnosis not present

## 2023-11-16 DIAGNOSIS — G4761 Periodic limb movement disorder: Secondary | ICD-10-CM | POA: Diagnosis not present

## 2023-11-16 DIAGNOSIS — G4733 Obstructive sleep apnea (adult) (pediatric): Secondary | ICD-10-CM | POA: Diagnosis not present

## 2023-11-19 DIAGNOSIS — R29898 Other symptoms and signs involving the musculoskeletal system: Secondary | ICD-10-CM | POA: Diagnosis not present

## 2023-11-19 DIAGNOSIS — M25561 Pain in right knee: Secondary | ICD-10-CM | POA: Diagnosis not present

## 2023-11-19 DIAGNOSIS — Z96651 Presence of right artificial knee joint: Secondary | ICD-10-CM | POA: Diagnosis not present

## 2023-11-19 DIAGNOSIS — G8929 Other chronic pain: Secondary | ICD-10-CM | POA: Diagnosis not present

## 2023-11-22 DIAGNOSIS — R29898 Other symptoms and signs involving the musculoskeletal system: Secondary | ICD-10-CM | POA: Diagnosis not present

## 2023-11-22 DIAGNOSIS — G8929 Other chronic pain: Secondary | ICD-10-CM | POA: Diagnosis not present

## 2023-11-22 DIAGNOSIS — Z96651 Presence of right artificial knee joint: Secondary | ICD-10-CM | POA: Diagnosis not present

## 2023-11-22 DIAGNOSIS — M25561 Pain in right knee: Secondary | ICD-10-CM | POA: Diagnosis not present

## 2023-11-26 DIAGNOSIS — G8929 Other chronic pain: Secondary | ICD-10-CM | POA: Diagnosis not present

## 2023-11-26 DIAGNOSIS — Z96651 Presence of right artificial knee joint: Secondary | ICD-10-CM | POA: Diagnosis not present

## 2023-11-26 DIAGNOSIS — R29898 Other symptoms and signs involving the musculoskeletal system: Secondary | ICD-10-CM | POA: Diagnosis not present

## 2023-11-26 DIAGNOSIS — M25561 Pain in right knee: Secondary | ICD-10-CM | POA: Diagnosis not present

## 2023-11-29 DIAGNOSIS — G8929 Other chronic pain: Secondary | ICD-10-CM | POA: Diagnosis not present

## 2023-11-29 DIAGNOSIS — Z96651 Presence of right artificial knee joint: Secondary | ICD-10-CM | POA: Diagnosis not present

## 2023-11-29 DIAGNOSIS — R29898 Other symptoms and signs involving the musculoskeletal system: Secondary | ICD-10-CM | POA: Diagnosis not present

## 2023-11-29 DIAGNOSIS — M25561 Pain in right knee: Secondary | ICD-10-CM | POA: Diagnosis not present

## 2023-12-03 DIAGNOSIS — Z96651 Presence of right artificial knee joint: Secondary | ICD-10-CM | POA: Diagnosis not present

## 2023-12-03 DIAGNOSIS — R29898 Other symptoms and signs involving the musculoskeletal system: Secondary | ICD-10-CM | POA: Diagnosis not present

## 2023-12-03 DIAGNOSIS — M25561 Pain in right knee: Secondary | ICD-10-CM | POA: Diagnosis not present

## 2023-12-03 DIAGNOSIS — G8929 Other chronic pain: Secondary | ICD-10-CM | POA: Diagnosis not present

## 2024-01-19 ENCOUNTER — Ambulatory Visit: Admitting: Internal Medicine

## 2024-01-24 ENCOUNTER — Ambulatory Visit: Admitting: Internal Medicine

## 2024-01-24 VITALS — BP 130/80 | HR 93 | Temp 97.8°F | Resp 18 | Wt 180.0 lb

## 2024-01-24 DIAGNOSIS — R8290 Unspecified abnormal findings in urine: Secondary | ICD-10-CM

## 2024-01-24 DIAGNOSIS — Z1321 Encounter for screening for nutritional disorder: Secondary | ICD-10-CM | POA: Diagnosis not present

## 2024-01-24 DIAGNOSIS — R5383 Other fatigue: Secondary | ICD-10-CM | POA: Insufficient documentation

## 2024-01-24 LAB — POCT URINALYSIS DIPSTICK
Glucose, UA: NEGATIVE
Ketones, UA: NEGATIVE
Leukocytes, UA: NEGATIVE
Nitrite, UA: NEGATIVE
Protein, UA: NEGATIVE
Spec Grav, UA: 1.01 (ref 1.010–1.025)
Urobilinogen, UA: NEGATIVE U/dL — AB
pH, UA: 6.5 (ref 5.0–8.0)

## 2024-01-24 NOTE — Addendum Note (Signed)
 Addended by: VAN EYK, Lydon Vansickle on: 01/24/2024 03:33 PM   Modules accepted: Orders

## 2024-01-24 NOTE — Addendum Note (Signed)
 Addended byBETHA HILDEGARD ERNST on: 01/24/2024 03:50 PM   Modules accepted: Orders

## 2024-01-24 NOTE — Progress Notes (Signed)
 Office Visit  Subjective   Patient ID: Jacqueline Willis   DOB: 1965/10/03   Age: 58 y.o.   MRN: [id number]   Chief Complaint Chief Complaint  Patient presents with   office visit    Very strong odor to urine     History of Present Illness Jacqueline Willis is a 58 yo female who comes in today with complaints with fatigue which started about 1.5 months ago.  She states she can be at work sitting and feels drained.  She denies any depression.  She is sleeping 6-7 hours per night.  The patient has a history of sleep apnea and is in process of getting her CPAP.  She states she has brain fog and feels exhausted.  She has not had any tick bites, fevers, chills, headaches, myalgias, nausea, vomiting, diarrhea, weight loss, SOB, chest pain, palpitations, or other problems.     Past Medical History Past Medical History:  Diagnosis Date   Arthritis    Chronic venous insufficiency    Complication of anesthesia    problem breathing after colonoscopy   Dercum disease    Fatty liver disease, nonalcoholic    History of DVT (deep vein thrombosis)    History of pulmonary embolus (PE)    History of simple renal cyst    Hypercholesterolemia    OSA (obstructive sleep apnea)    Vitamin D deficiency      Allergies Allergies  Allergen Reactions   Sulfa Antibiotics Anaphylaxis   Vancomycin Itching     Medications  Current Outpatient Medications:    aspirin  81 MG chewable tablet, Chew 81 mg by mouth daily., Disp: , Rfl:    Multiple Vitamin (MULTIVITAMIN) tablet, Take 1 tablet by mouth daily., Disp: , Rfl:    polyethylene glycol powder (GLYCOLAX /MIRALAX ) 17 GM/SCOOP powder, Dissolve 17 g in liquid and take by mouth 2 (two) times daily., Disp: 952 g, Rfl: 0   Review of Systems Review of Systems  Constitutional:  Negative for chills and fever.  Eyes:  Negative for blurred vision and double vision.  Respiratory:  Negative for cough and shortness of breath.   Cardiovascular:  Negative for chest  pain, palpitations and leg swelling.  Gastrointestinal:  Negative for abdominal pain, constipation, diarrhea, nausea and vomiting.  Genitourinary:  Negative for dysuria, frequency and hematuria.  Musculoskeletal:  Positive for myalgias.  Skin:  Negative for itching and rash.  Neurological:  Negative for dizziness, weakness and headaches.  Endo/Heme/Allergies:  Negative for polydipsia.       Objective:    Vitals BP 130/80   Pulse 93   Temp 97.8 F (36.6 C)   Resp 18   Wt 180 lb (81.6 kg)   SpO2 98%   BMI 30.90 kg/m    Physical Examination Physical Exam Constitutional:      Appearance: Normal appearance. She is not ill-appearing.  Cardiovascular:     Rate and Rhythm: Normal rate and regular rhythm.     Pulses: Normal pulses.     Heart sounds: No murmur heard.    No friction rub. No gallop.  Pulmonary:     Effort: Pulmonary effort is normal. No respiratory distress.     Breath sounds: No wheezing, rhonchi or rales.  Abdominal:     General: Bowel sounds are normal. There is no distension.     Palpations: Abdomen is soft.     Tenderness: There is no abdominal tenderness.  Musculoskeletal:     Right lower leg: No edema.  Left lower leg: No edema.  Skin:    General: Skin is warm and dry.     Findings: No rash.  Neurological:     Mental Status: She is alert.        Assessment & Plan:   Other fatigue She denies any depression.  She has some foul smelling urine however her UA was completely normal.  We will obtain some labs and further care will depend on her lab workup.    No follow-ups on file.   Selinda Fleeta Finger, MD

## 2024-01-24 NOTE — Assessment & Plan Note (Signed)
 She denies any depression.  She has some foul smelling urine however her UA was completely normal.  We will obtain some labs and further care will depend on her lab workup.

## 2024-02-02 LAB — CMP14 + ANION GAP
ALT: 22 IU/L (ref 0–32)
AST: 26 IU/L (ref 0–40)
Albumin: 4.5 g/dL (ref 3.8–4.9)
Alkaline Phosphatase: 78 IU/L (ref 49–135)
Anion Gap: 16 mmol/L (ref 10.0–18.0)
BUN/Creatinine Ratio: 21 (ref 9–23)
BUN: 15 mg/dL (ref 6–24)
Bilirubin Total: 0.5 mg/dL (ref 0.0–1.2)
CO2: 24 mmol/L (ref 20–29)
Calcium: 9.8 mg/dL (ref 8.7–10.2)
Chloride: 101 mmol/L (ref 96–106)
Creatinine, Ser: 0.72 mg/dL (ref 0.57–1.00)
Globulin, Total: 2.9 g/dL (ref 1.5–4.5)
Glucose: 84 mg/dL (ref 70–99)
Potassium: 4.3 mmol/L (ref 3.5–5.2)
Sodium: 141 mmol/L (ref 134–144)
Total Protein: 7.4 g/dL (ref 6.0–8.5)
eGFR: 96 mL/min/1.73 (ref >59)

## 2024-02-02 LAB — CBC WITH DIFFERENTIAL/PLATELET
Basophils Absolute: 0.1 x10E3/uL (ref 0.0–0.2)
Basos: 2 %
EOS (ABSOLUTE): 0.1 x10E3/uL (ref 0.0–0.4)
Eos: 4 %
Hematocrit: 42.7 % (ref 34.0–46.6)
Hemoglobin: 14.2 g/dL (ref 11.1–15.9)
Immature Grans (Abs): 0 x10E3/uL (ref 0.0–0.1)
Immature Granulocytes: 0 %
Lymphocytes Absolute: 1.5 x10E3/uL (ref 0.7–3.1)
Lymphs: 37 %
MCH: 31.7 pg (ref 26.6–33.0)
MCHC: 33.3 g/dL (ref 31.5–35.7)
MCV: 95 fL (ref 79–97)
Monocytes Absolute: 0.5 x10E3/uL (ref 0.1–0.9)
Monocytes: 11 %
Neutrophils Absolute: 1.9 x10E3/uL (ref 1.4–7.0)
Neutrophils: 46 %
Platelets: 221 x10E3/uL (ref 150–450)
RBC: 4.48 x10E6/uL (ref 3.77–5.28)
RDW: 11.9 % (ref 11.7–15.4)
WBC: 4 x10E3/uL (ref 3.4–10.8)

## 2024-02-02 LAB — VITAMIN D 1,25 DIHYDROXY
Vitamin D 1, 25 (OH)2 Total: 61 pg/mL
Vitamin D2 1, 25 (OH)2: <10 pg/mL
Vitamin D3 1, 25 (OH)2: 53 pg/mL

## 2024-02-02 LAB — TSH: TSH: 1.78 u[IU]/mL (ref 0.450–4.500)

## 2024-02-02 LAB — SEDIMENTATION RATE: Sed Rate: 15 mm/h (ref 0–40)

## 2024-02-02 LAB — CK: Total CK: 178 U/L (ref 32–182)

## 2024-02-08 ENCOUNTER — Encounter: Payer: Self-pay | Admitting: Internal Medicine

## 2024-02-08 ENCOUNTER — Ambulatory Visit: Admitting: Internal Medicine

## 2024-02-08 VITALS — BP 148/92 | HR 91 | Temp 98.0°F | Resp 16 | Ht 64.0 in | Wt 184.8 lb

## 2024-02-08 DIAGNOSIS — E7800 Pure hypercholesterolemia, unspecified: Secondary | ICD-10-CM

## 2024-02-08 DIAGNOSIS — E66811 Obesity, class 1: Secondary | ICD-10-CM | POA: Diagnosis not present

## 2024-02-08 DIAGNOSIS — Z6831 Body mass index (BMI) 31.0-31.9, adult: Secondary | ICD-10-CM | POA: Diagnosis not present

## 2024-02-08 MED ORDER — PHENTERMINE HCL 37.5 MG PO TABS: 37.5000 mg | ORAL_TABLET | Freq: Every day | ORAL | 2 refills | Status: DC

## 2024-02-08 MED ORDER — MOUNJARO 2.5 MG/0.5ML ~~LOC~~ SOAJ: 2.5000 mg | SUBCUTANEOUS | Status: DC

## 2024-02-08 NOTE — Assessment & Plan Note (Signed)
 Plan as above.

## 2024-02-08 NOTE — Assessment & Plan Note (Signed)
 I want her to cut fats in her diet and exercise more.

## 2024-02-08 NOTE — Progress Notes (Addendum)
 Office Visit  Subjective   Patient ID: Jacqueline Willis   DOB: [birth year]   Age: 58 y.o.   MRN: [id number]   Chief Complaint Chief Complaint  Patient presents with   Follow-up    3 Month follow up     History of Present Illness The patient also returns for followup of her weight loss management.  We tried to get her on zepbound  in the past but her insurance would not cover this.  Her last dose of adipex was 3 months ago.  She was previously tried on [employer].  She loss some weight with adipex at the beginning but then stopped losing weight and we stopped her adipex at the end of last year in 2024.  She had tried wegovy in the past but had side effects with blurred vision.  The patient was exercising up until she had her knee surgery this past year.  She is exercising as described and she is eating healthy. She has been having sugar cravings. She denies any fast food and she denies any soft drinks or sweet tea.      Jacqueline Willis returns today for routine followup on her cholesterol.  She has been told by multiple doctors that she has fatty liver disease.  She has Dercums disease and we have given her names of specialists to research and let us  know if she would like a referral.   She has ongoing pain, weight issues, painful fatty deposits in her thighs and lower legs, fatigue and weakness which are s/s of Dercums. We have been managing the symptoms.  Overall, she states she is doing well and is without any complaints or problems at this time.  Oh her last visit in 10/2023, her ASCVD score was 3.8%.  I asked her to watch her diet and esercise.  She specifically denies chest pain, abdominal pain, nausea, diarrhea, and myalgias. She remains on dietary management alone for intervention and claims to be adhering well to it thus far. She is fasting in anticipation for labs today.      Past Medical History Past Medical History:  Diagnosis Date   Arthritis    Chronic venous insufficiency     Complication of anesthesia    problem breathing after colonoscopy   Dercum disease    Fatty liver disease, nonalcoholic    History of DVT (deep vein thrombosis)    History of pulmonary embolus (PE)    History of simple renal cyst    Hypercholesterolemia    OSA (obstructive sleep apnea)    Vitamin D deficiency      Allergies Allergies  Allergen Reactions   Sulfa Antibiotics Anaphylaxis   Vancomycin Itching     Medications  Current Outpatient Medications:    aspirin  81 MG chewable tablet, Chew 81 mg by mouth daily., Disp: , Rfl:    Multiple Vitamin (MULTIVITAMIN) tablet, Take 1 tablet by mouth daily., Disp: , Rfl:    polyethylene glycol powder (GLYCOLAX /MIRALAX ) 17 GM/SCOOP powder, Dissolve 17 g in liquid and take by mouth 2 (two) times daily., Disp: 952 g, Rfl: 0   Review of Systems Review of Systems  Constitutional:  Positive for malaise/fatigue. Negative for chills, fever and weight loss.  Respiratory:  Negative for cough and shortness of breath.   Cardiovascular:  Negative for chest pain, palpitations and leg swelling.  Gastrointestinal:  Negative for abdominal pain, constipation, diarrhea, heartburn, nausea and vomiting.  Musculoskeletal:  Negative for myalgias.  Skin:  Negative for itching and rash.  Neurological:  Negative for dizziness, weakness and headaches.       Objective:    Vitals BP (!) 148/92   Pulse 91   Temp 98 F (36.7 C) (Temporal)   Resp 16   Ht 5' 4 (1.626 m)   Wt 184 lb 12.8 oz (83.8 kg)   SpO2 99%   BMI 31.72 kg/m    Physical Examination Physical Exam Constitutional:      Appearance: Normal appearance. She is not ill-appearing.  Cardiovascular:     Rate and Rhythm: Normal rate and regular rhythm.     Pulses: Normal pulses.     Heart sounds: No murmur heard.    No friction rub. No gallop.  Pulmonary:     Effort: Pulmonary effort is normal. No respiratory distress.     Breath sounds: No wheezing, rhonchi or rales.  Abdominal:      General: Bowel sounds are normal. There is no distension.     Palpations: Abdomen is soft.     Tenderness: There is no abdominal tenderness.  Musculoskeletal:     Right lower leg: No edema.     Left lower leg: No edema.  Skin:    General: Skin is warm and dry.     Findings: No rash.  Neurological:     Mental Status: She is alert.  Psychiatric:        Mood and Affect: Mood normal.        Behavior: Behavior normal.        Assessment & Plan:   Hypercholesterolemia I want her to cut fats in her diet and exercise more.  BMI 31.0-31.9,adult We will restart her on adipex and I will also start her on some mounjaro/zepbound  2.5mg  subuct weekly. Samples were given.  I want her to eat healthy, exercise and continue to lose weight.  Our goal will be to lose 3-4 lbs per month while on medications.  Class 1 obesity due to excess calories with serious comorbidity and body mass index (BMI) of 31.0 to 31.9 in adult Plan as above.    Return in about 3 months (around 05/10/2024).   Selinda Fleeta Finger, MD

## 2024-02-08 NOTE — Addendum Note (Signed)
 Addended by: VAN EYK, Alexandrina Fiorini on: 02/08/2024 02:27 PM   Modules accepted: Orders

## 2024-02-08 NOTE — Assessment & Plan Note (Signed)
 We will restart her on adipex and I will also start her on some mounjaro/zepbound  2.5mg  subuct weekly. Samples were given.  I want her to eat healthy, exercise and continue to lose weight.  Our goal will be to lose 3-4 lbs per month while on medications.

## 2024-03-13 DIAGNOSIS — R5383 Other fatigue: Secondary | ICD-10-CM | POA: Diagnosis not present

## 2024-03-13 DIAGNOSIS — G4733 Obstructive sleep apnea (adult) (pediatric): Secondary | ICD-10-CM | POA: Diagnosis not present

## 2024-03-13 DIAGNOSIS — J4520 Mild intermittent asthma, uncomplicated: Secondary | ICD-10-CM | POA: Diagnosis not present

## 2024-03-13 DIAGNOSIS — R400 Somnolence: Secondary | ICD-10-CM | POA: Diagnosis not present

## 2024-05-02 ENCOUNTER — Encounter: Payer: Self-pay | Admitting: Internal Medicine

## 2024-05-02 ENCOUNTER — Ambulatory Visit: Admitting: Internal Medicine

## 2024-05-02 VITALS — BP 158/90 | HR 87 | Temp 98.5°F | Resp 18 | Ht 64.0 in | Wt 194.0 lb

## 2024-05-02 DIAGNOSIS — M79671 Pain in right foot: Secondary | ICD-10-CM

## 2024-05-02 DIAGNOSIS — G4733 Obstructive sleep apnea (adult) (pediatric): Secondary | ICD-10-CM

## 2024-05-02 DIAGNOSIS — I890 Lymphedema, not elsewhere classified: Secondary | ICD-10-CM | POA: Diagnosis not present

## 2024-05-02 DIAGNOSIS — R5383 Other fatigue: Secondary | ICD-10-CM | POA: Diagnosis not present

## 2024-05-02 DIAGNOSIS — M79672 Pain in left foot: Secondary | ICD-10-CM | POA: Insufficient documentation

## 2024-05-02 NOTE — Progress Notes (Signed)
" ° °  Office Visit  Subjective   Patient ID: Jacqueline Willis   DOB: [birth year]   Age: 59 y.o.   MRN: [id number]   Chief Complaint Chief Complaint  Patient presents with   Follow-up    3 Month follow up     History of Present Illness 59 years old female is here c/o staying tired.   Past Medical History Past Medical History:  Diagnosis Date   Arthritis    Chronic venous insufficiency    Complication of anesthesia    problem breathing after colonoscopy   Dercum disease    Fatty liver disease, nonalcoholic    History of DVT (deep vein thrombosis)    History of pulmonary embolus (PE)    History of simple renal cyst    Hypercholesterolemia    OSA (obstructive sleep apnea)    Vitamin D  deficiency      Allergies Allergies[1]   Review of Systems ROS     Objective:    Vitals BP (!) 158/90   Pulse 87   Temp 98.5 F (36.9 C) (Temporal)   Resp 18   Ht 5' 4 (1.626 m)   Wt 194 lb (88 kg)   SpO2 97%   BMI 33.30 kg/m    Physical Examination Physical Exam     Assessment & Plan:   No problem-specific Assessment & Plan notes found for this encounter.    Return in about 1 month (around 06/02/2024).   [name], MD      [1]  Allergies Allergen Reactions   Sulfa Antibiotics Anaphylaxis   Vancomycin Itching   "

## 2024-05-03 NOTE — Assessment & Plan Note (Signed)
"    I believe lack of sleep is contributing to her staying fatigued.  I have suggested to use coenzyme Q10 and B complex 1 tablets daily.  She will discuss with Dr. Mardee about medicine to help her sleep. "

## 2024-05-03 NOTE — Assessment & Plan Note (Signed)
"   I have suggested her to use elastic stocking 1st thing in the morning and apply during daytime and remove at nighttime.  If that did not help then she may need other options. "

## 2024-05-03 NOTE — Assessment & Plan Note (Signed)
"    She has calluses and fissure in calluses of right heel.  I have suggested to use Epsom salt and apply callus or more.  She may need to see foot doctor if symptoms are not better.  She will also apply extra cushion to her both heel. "

## 2024-05-08 ENCOUNTER — Ambulatory Visit: Admitting: Internal Medicine

## 2024-05-09 ENCOUNTER — Ambulatory Visit: Admitting: Internal Medicine

## 2024-05-16 ENCOUNTER — Ambulatory Visit: Admitting: Internal Medicine

## 2024-05-16 ENCOUNTER — Encounter: Payer: Self-pay | Admitting: Internal Medicine

## 2024-05-16 VITALS — BP 140/78 | HR 85 | Temp 98.4°F | Resp 18 | Ht 64.0 in | Wt 193.0 lb

## 2024-05-16 DIAGNOSIS — R0981 Nasal congestion: Secondary | ICD-10-CM | POA: Diagnosis not present

## 2024-05-16 DIAGNOSIS — J0100 Acute maxillary sinusitis, unspecified: Secondary | ICD-10-CM | POA: Insufficient documentation

## 2024-05-16 DIAGNOSIS — J039 Acute tonsillitis, unspecified: Secondary | ICD-10-CM | POA: Insufficient documentation

## 2024-05-16 LAB — POC COVID19 BINAXNOW: SARS Coronavirus 2 Ag: NEGATIVE

## 2024-05-16 LAB — POC INFLUENZA A&B (BINAX/QUICKVUE)
Influenza A, POC: NEGATIVE
Influenza B, POC: NEGATIVE

## 2024-05-16 MED ORDER — FLUTICASONE PROPIONATE 50 MCG/ACT NA SUSP: 1.0000 | Freq: Every day | NASAL | 2 refills | Status: AC

## 2024-05-16 MED ORDER — SALINE SPRAY 0.65 % NA SOLN: 1.0000 | NASAL | 0 refills | Status: AC | PRN

## 2024-05-16 MED ORDER — CLINDAMYCIN HCL 300 MG PO CAPS: 300.0000 mg | ORAL_CAPSULE | Freq: Three times a day (TID) | ORAL | 0 refills | Status: AC

## 2024-05-16 NOTE — Assessment & Plan Note (Signed)
"    She will also use fluticasone  1 spray to each nostril at nighttime and saline mist 2 spray to each nostril 3 times a day  As needed "

## 2024-05-16 NOTE — Assessment & Plan Note (Signed)
"    Her rapid strep is negative.  As she has significant inflammation and pus I will start her on clindamycin  300 mg twice a day for 7 days.  She is allergic to penicillin. "

## 2024-05-16 NOTE — Progress Notes (Signed)
" ° °  Acute Office Visit  Subjective:     Patient ID: Jacqueline Willis, female    DOB: [birth year], 59 y.o.   MRN: [id number]  Chief Complaint  Patient presents with   Office visit    Congestion, and sharp pain in ear.    HPI Patient is in today for pain in her right ear, nasal congestion and sore throat for 4 days.  She feel pressure on her face particularly when she bent forward.  No wheezing and no shortness of breath.  He is not using any medicine for her symptoms.  Review of Systems  Constitutional: Negative.   HENT:  Positive for congestion, ear pain and sore throat.   Respiratory: Negative.          Objective:    BP (!) 140/78   Pulse 85   Temp 98.4 F (36.9 C)   Resp 18   Ht 5' 4 (1.626 m)   Wt 193 lb (87.5 kg)   SpO2 98%   BMI 33.13 kg/m    Physical Exam Constitutional:      Appearance: Normal appearance.  HENT:     Mouth/Throat:     Pharynx: Oropharyngeal exudate and posterior oropharyngeal erythema present.  Cardiovascular:     Rate and Rhythm: Normal rate and regular rhythm.     Heart sounds: Normal heart sounds.  Pulmonary:     Effort: Pulmonary effort is normal.     Breath sounds: Normal breath sounds.  Neurological:     Mental Status: She is alert.     Results for orders placed or performed in visit on 05/16/24  POC COVID-19 BinaxNow  Result Value Ref Range   SARS Coronavirus 2 Ag Negative Negative  POC Influenza A&B(BINAX/QUICKVUE)  Result Value Ref Range   Influenza A, POC Negative Negative   Influenza B, POC Negative Negative        Assessment & Plan:   Problem List Items Addressed This Visit       Respiratory   Congestion of nasal sinus - Primary   Relevant Orders   POC COVID-19 BinaxNow (Completed)   POC Influenza A&B(BINAX/QUICKVUE) (Completed)   Acute non-recurrent maxillary sinusitis     She will also use fluticasone  1 spray to each nostril at nighttime and saline mist 2 spray to each nostril 3 times a day  As  needed      Relevant Medications   clindamycin  (CLEOCIN ) 300 MG capsule   fluticasone  (FLONASE ) 50 MCG/ACT nasal spray   sodium chloride  (OCEAN) 0.65 % SOLN nasal spray   Tonsillitis     Her rapid strep is negative.  As she has significant inflammation and pus I will start her on clindamycin  300 mg twice a day for 7 days.  She is allergic to penicillin.      Relevant Medications   clindamycin  (CLEOCIN ) 300 MG capsule    No orders of the defined types were placed in this encounter.   No follow-ups on file.  Roetta Dare, MD   "

## 2024-05-30 ENCOUNTER — Ambulatory Visit: Admitting: Internal Medicine

## 2024-06-06 ENCOUNTER — Ambulatory Visit: Admitting: Internal Medicine
